# Patient Record
Sex: Female | Born: 1995 | Race: White | Hispanic: No | Marital: Single | Smoking: Current some day smoker
Health system: Southern US, Community
[De-identification: ages and names within clinical notes are randomized; demographics above are authoritative.]

## PROBLEM LIST (undated history)

## (undated) DIAGNOSIS — K831 Obstruction of bile duct: Secondary | ICD-10-CM

## (undated) DIAGNOSIS — O26619 Liver and biliary tract disorders in pregnancy, unspecified trimester: Secondary | ICD-10-CM

## (undated) DIAGNOSIS — Z789 Other specified health status: Secondary | ICD-10-CM

## (undated) DIAGNOSIS — O26649 Intrahepatic cholestasis of pregnancy, unspecified trimester: Secondary | ICD-10-CM

## (undated) DIAGNOSIS — O99011 Anemia complicating pregnancy, first trimester: Secondary | ICD-10-CM

## (undated) HISTORY — DX: Liver and biliary tract disorders in pregnancy, unspecified trimester: O26.619

## (undated) HISTORY — DX: Obstruction of bile duct: K83.1

## (undated) HISTORY — PX: NO PAST SURGERIES: SHX2092

## (undated) HISTORY — DX: Anemia complicating pregnancy, first trimester: O99.011

## (undated) HISTORY — DX: Intrahepatic cholestasis of pregnancy, unspecified trimester: O26.649

---
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
## 2017-07-25 ENCOUNTER — Encounter (HOSPITAL_COMMUNITY): Payer: Self-pay | Admitting: Obstetrics & Gynecology

## 2017-09-17 NOTE — L&D Delivery Note (Signed)
Patient is 22 y.o. G2P1001 8569w4d admitted active labor. She progressed without augmentation. AROM at 2044. Prenatal course uncomplicated.  Delivery Note At 9:08 PM a healthy female was delivered via Vaginal, Spontaneous (Presentation:ROA).  APGAR: 9, 9; weight: Pending .   Placenta status: intact.  Cord: 3 vessel cord with no avulsion from placenta with the following no complications.  Cord pH: N/A.  Anesthesia:  No epidural Episiotomy: None Lacerations: None Suture Repair: None Est. Blood Loss (mL): 100  Mom to postpartum.  Baby to Couplet care / Skin to Skin.  Rolland BimlerLexi J Betancourt, MS3 03/24/2018, 9:24 PM  OB FELLOW DELIVERY ATTESTATION  I was gloved and present for the delivery in its entirety, and I agree with the above resident's note.    Caryl AdaJazma Gamble Enderle, DO OB Fellow 10:01 PM

## 2017-10-25 ENCOUNTER — Encounter: Payer: Self-pay | Admitting: Obstetrics and Gynecology

## 2017-10-25 ENCOUNTER — Other Ambulatory Visit (HOSPITAL_COMMUNITY)
Admission: RE | Admit: 2017-10-25 | Discharge: 2017-10-25 | Disposition: A | Discharge status: Home / Self Care | Payer: Medicaid Other | Source: Ambulatory Visit | Attending: Obstetrics and Gynecology | Admitting: Obstetrics and Gynecology

## 2017-10-25 ENCOUNTER — Ambulatory Visit (INDEPENDENT_AMBULATORY_CARE_PROVIDER_SITE_OTHER): Payer: Medicaid Other | Admitting: Obstetrics and Gynecology

## 2017-10-25 DIAGNOSIS — Z3492 Encounter for supervision of normal pregnancy, unspecified, second trimester: Secondary | ICD-10-CM

## 2017-10-25 DIAGNOSIS — Z3490 Encounter for supervision of normal pregnancy, unspecified, unspecified trimester: Secondary | ICD-10-CM | POA: Insufficient documentation

## 2017-10-25 DIAGNOSIS — N949 Unspecified condition associated with female genital organs and menstrual cycle: Secondary | ICD-10-CM

## 2017-10-25 LAB — POCT URINALYSIS DIP (DEVICE)
Bilirubin Urine: NEGATIVE
Glucose, UA: NEGATIVE mg/dL
HGB URINE DIPSTICK: NEGATIVE
KETONES UR: NEGATIVE mg/dL
Nitrite: NEGATIVE
PROTEIN: NEGATIVE mg/dL
SPECIFIC GRAVITY, URINE: 1.015 (ref 1.005–1.030)
UROBILINOGEN UA: 0.2 mg/dL (ref 0.0–1.0)
pH: 7 (ref 5.0–8.0)

## 2017-10-25 NOTE — Patient Instructions (Addendum)
Childbirth Education Options: [facility] Department Classes:  Childbirth education classes can help you get ready for a positive parenting experience. You can also meet other expectant parents and get free stuff for your baby. Each class runs for five weeks on the same night and costs $45 for the mother-to-be and her support person. Medicaid covers the cost if you are eligible. Call [phone] to register. [facility] Childbirth Education:  [phone] or [phone] or [email or web address]  Baby & Me Class: Discuss newborn & infant parenting and family adjustment issues with other new mothers in a relaxed environment. Each week brings a new speaker or baby-centered activity. We encourage new mothers to join [place] every Thursday at 11:00am. Babies birth until crawling. No registration or fee. Daddy [employer]: This course offers Dads-to-be the tools and knowledge needed to feel confident on their journey to becoming new fathers. Experienced dads, who have been trained as coaches, teach dads-to-be how to hold, comfort, diaper, swaddle and play with their infant while being able to support the new mom as well. A class for men taught by men. $25/dad Big Brother/Big Sister: Let your children share in the joy of a new brother or sister in this special class designed just for them. Class includes discussion about how families care for babies: swaddling, holding, diapering, safety as well as how they can be helpful in their new role. This class is designed for children ages 45 to 48, but any age is welcome. Please register each child individually. $5/child  Mom Talk: This mom-led group offers support and connection to mothers as they journey through the adjustments and struggles of that sometimes overwhelming first year after the birth of a child. Tuesdays at 10:00am and Thursdays at 6:00pm. Babies welcome. No registration or fee. Breastfeeding Support Group: This group is a mother-to-mother  support circle where moms have the opportunity to share their breastfeeding experiences. A Lactation Consultant is present for questions and concerns. Meets each Tuesday at 11:00am. No fee or registration. Breastfeeding Your Baby: Learn what to expect in the first days of breastfeeding your newborn.  This class will help you feel more confident with the skills needed to begin your breastfeeding experience. Many new mothers are concerned about breastfeeding after leaving the hospital. This class will also address the most common fears and challenges about breastfeeding during the first few weeks, months and beyond. (call for fee) Comfort Techniques and Tour: This 2 hour interactive class will provide you the opportunity to learn & practice hands-on techniques that can help relieve some of the discomfort of labor and encourage your baby to rotate toward the best position for birth. You and your partner will be able to try a variety of labor positions with birth balls and rebozos as well as practice breathing, relaxation, and visualization techniques. A tour of the [facility] is included with this class. $20 per registrant and support person Childbirth Class- Weekend Option: This class is a Weekend version of our Birth & Baby series. It is designed for parents who have a difficult time fitting several weeks of classes into their schedule. It covers the care of your newborn and the basics of labor and childbirth. It also includes a [place] of [facility] and lunch. The class is held two consecutive days: beginning on Friday evening from 6:30 - 8:30 p.m. and the next day, Saturday from 9 a.m. - 4 p.m. (call for fee) Doren Custard Class: Interested in a waterbirth?  This  informational class will help you discover whether waterbirth is the right fit for you. Education about waterbirth itself, supplies you would need and how to assemble your support team is what you can  expect from this class. Some obstetrical practices require this class in order to pursue a waterbirth. (Not all obstetrical practices offer waterbirth-check with your healthcare provider.) Register only the expectant mom, but you are encouraged to bring your partner to class! Required if planning waterbirth, no fee. Infant/Child CPR: Parents, grandparents, babysitters, and friends learn Cardio-Pulmonary Resuscitation skills for infants and children. You will also learn how to treat both conscious and unconscious choking in infants and children. This Family & Friends program does not offer certification. Register each participant individually to ensure that enough mannequins are available. (Call for fee) Grandparent Love: Expecting a grandbaby? This class is for you! Learn about the latest infant care and safety recommendations and ways to support your own child as he or she transitions into the parenting role. Taught by Registered Nurses who are childbirth instructors, but most importantly...they are grandmothers too! $10/person. Childbirth Class- Natural Childbirth: This series of 5 weekly classes is for expectant parents who want to learn and practice natural methods of coping with the process of labor and childbirth. Relaxation, breathing, massage, visualization, role of the partner, and helpful positioning are highlighted. Participants learn how to be confident in their body's ability to give birth. This class will empower and help parents make informed decisions about their own care. Includes discussion that will help new parents transition into the immediate postpartum period. Maternity Care Center Tour of [facility] is included. We suggest taking this class between 25-32 weeks, but it's only a recommendation. $75 per registrant and one support person or $30 Medicaid. Childbirth Class- 3 week Series: This option of 3 weekly classes helps you and your labor partner prepare for childbirth. Newborn  care, labor & birth, cesarean birth, pain management, and comfort techniques are discussed and a Maternity Care Center Tour of [facility] is included. The class meets at the same time, on the same day of the week for 3 consecutive weeks beginning with the starting date you choose. $60 for registrant and one support person.  Marvelous Multiples: Expecting twins, triplets, or more? This class covers the differences in labor, birth, parenting, and breastfeeding issues that face multiples' parents. NICU tour is included. Led by a Certified Childbirth Educator who is the mother of twins. No fee. Caring for Baby: This class is for expectant and adoptive parents who want to learn and practice the most up-to-date newborn care for their babies. Focus is on birth through the first six weeks of life. Topics include feeding, bathing, diapering, crying, umbilical cord care, circumcision care and safe sleep. Parents learn to recognize symptoms of illness and when to call the pediatrician. Register only the mom-to-be and your partner or support person can plan to come with you! $10 per registrant and support person Childbirth Class- online option: This online class offers you the freedom to complete a Birth and Baby series in the comfort of your own home. The flexibility of this option allows you to review sections at your own pace, at times convenient to you and your support people. It includes additional video information, animations, quizzes, and extended activities. Get organized with helpful eClass tools, checklists, and trackers. Once you register online for the class, you will receive an email within a few days to accept the invitation and begin the class when the time   is right for you. The content will be available to you for 60 days. $60 for 60 days of online access for you and your support people.  Local Doulas: Natural Baby Doulas [email or web address] Tel:  [phone] [email or web address] [employer] [phone] [email or web address] The Labor Hassell Halim  (also do waterbirth tub rental) [phone] [email or web address] [facility] Birth Doula [phone] [email or web address] NotebookDistributors.fi Sacred Rhythms  [phone] https://[email or web address]-[email or web address]/ [employer] (PADA) [email or web address] https://www.[email or web address]/ [place] Birth and Baby  http://[email or web address]/ Considering Waterbirth? Guide for patients at [facility] [employer]  Why consider waterbirth?  . Gentle birth for babies . Less pain medicine used in labor . May allow for passive descent/less pushing . May reduce perineal tears  . More mobility and instinctive maternal position changes . Increased maternal relaxation . Reduced blood pressure in labor  Is waterbirth safe? What are the risks of infection, drowning or other complications?  . Infection: o Very low risk (3.7 % for tub vs 4.8% for bed) o 7 in 8000 waterbirths with documented infection o Poorly cleaned equipment most common cause o Slightly lower group B strep transmission rate  . Drowning o Maternal:  - Very low risk   - Related to seizures or fainting o Newborn:  - Very low risk. No evidence of increased risk of respiratory problems in multiple large studies - Physiological protection from breathing under water - Avoid underwater birth if there are any fetal complications - Once baby's head is out of the water, keep it out.  . Birth complication o Some reports of cord trauma, but risk decreased by bringing baby to surface gradually o No evidence of increased risk of shoulder dystocia. Mothers can usually change positions faster in water than in a bed, possibly aiding the maneuvers to free the shoulder.   You must attend a Doren Custard class at [facility]  3rd Wednesday of every month from 7-9pm  [employer] by calling [phone] or online at [email or web address]  Bring [place] the certificate from the class to your prenatal appointment  Meet with a midwife at 36 weeks to see if you can still plan a waterbirth and to sign the consent.   Purchase or rent the following supplies:   Water Birth Pool (Birth Pool in a Box or [place] for instance)  (Tubs start ~$125)  Single-use disposable tub liner designed for your brand of tub  New garden hose labeled "lead-free", "suitable for drinking water",  Electric drain pump to remove water (We recommend 792 gallon per hour or greater pump.)   Separate garden hose to remove the dirty water  Fish net  Bathing suit top (optional)  Long-handled mirror (optional)  Places to purchase or rent supplies  GotWebTools.is for tub purchases and supplies  Waterbirthsolutions.com for tub purchases and supplies  The Labor Ladies (www.[email or web address]) $275 for tub rental/set-up & take down/kit   [employer] (http://www.fleming.com/.htm) Information regarding doulas (labor support) who provide pool rentals  Our practice has a Birth Pool in a Box tub at the hospital that you may borrow on a first-come-first-served basis. It is your responsibility to to set up, clean and break down the tub. We cannot guarantee the availability of this tub in advance. You are responsible for bringing all accessories listed above. If you do not have all necessary supplies you cannot have a waterbirth.    Things that would prevent you from having a waterbirth:  Premature, <37wks  Previous cesarean birth  Presence of thick meconium-stained fluid  Multiple gestation (Twins,  triplets, etc.)  Uncontrolled diabetes or gestational diabetes requiring medication  Hypertension requiring medication or diagnosis of pre-eclampsia  Heavy vaginal bleeding  Non-reassuring fetal  heart rate  Active infection (MRSA, etc.). Group B Strep is NOT a contraindication for  waterbirth.  If your labor has to be induced and induction method requires continuous  monitoring of the baby's heart rate  Other risks/issues identified by your obstetrical provider  Please remember that birth is unpredictable. Under certain unforeseeable circumstances your provider may advise against giving birth in the tub. These decisions will be made on a case-by-case basis and with the safety of you and your baby as our highest priority.      Round Ligament Pain During Pregnancy   Round ligament pain is a sharp pain or jabbing feeling often felt in the lower belly or groin area on one or both sides. It is one of the most common complaints during pregnancy and is considered a normal part of pregnancy. It is most often felt during the second trimester.   Here is what you need to know about round ligament pain, including some tips to help you feel better.   Causes of Round Ligament Pain:    Several thick ligaments surround and support your womb (uterus) as it grows during pregnancy. One of them is called the round ligament.   The round ligament connects the front part of the womb to your groin, the area where your legs attach to your pelvis. The round ligament normally tightens and relaxes slowly.   As your baby and womb grow, the round ligament stretches. That makes it more likely to become strained.   Sudden movements can cause the ligament to tighten quickly, like a rubber band snapping. This causes a sudden and quick jabbing feeling.   Symptoms of Round Ligament Pain   Round ligament pain can be concerning and uncomfortable. But it is considered normal as your body changes during pregnancy.   The symptoms of round ligament pain include a sharp, sudden spasm in the belly. It usually affects the right side, but it may happen on both sides. The pain only lasts a few seconds.   Exercise may  cause the pain, as will rapid movements such as:   sneezing  coughing  laughing  rolling over in bed  standing up too quickly   Treatment of Round Ligament Pain   Here are some tips that may help reduce your discomfort:   Pain relief. Take over-the-counter acetaminophen for pain, if necessary. Ask your doctor if this is OK.   Exercise. Get plenty of exercise to keep your stomach (core) muscles strong. Doing stretching exercises or prenatal yoga can be helpful. Ask your doctor which exercises are safe for you and your baby.   A helpful exercise involves putting your hands and knees on the floor, lowering your head, and pushing your backside into the air.   Avoid sudden movements. Change positions slowly (such as standing up or sitting down) to avoid sudden movements that may cause stretching and pain.   Flex your hips. Bend and flex your hips before you cough, sneeze, or laugh to avoid pulling on the ligaments.   Apply warmth. A heating pad or warm bath may be helpful. Ask your doctor if this is OK. Extreme heat can be dangerous to the baby.   You should try to modify your daily activity level and avoid positions that may worsen the condition.   When to Call the Doctor/Midwife  Always tell your doctor or midwife about any type of pain you have during pregnancy. Round ligament pain is quick and doesn't last long.   Call your health care provider immediately if you have:   severe pain  fever  chills  pain on urination  difficulty walking   Belly pain during pregnancy can be due to many different causes. It is important for your doctor to rule out more serious conditions, including pregnancy complications such as placenta abruption or non-pregnancy illnesses such as:   inguinal hernia  appendicitis  stomach, liver, and kidney problems  Preterm labor pains may sometimes be mistaken for round ligament pain.

## 2017-10-25 NOTE — Progress Notes (Signed)
Subjective:   Shannon Trujillo is a 22 y.o. G2P1001 at [redacted]w[redacted]d by LMP being seen today for her first obstetrical visit.  Her obstetrical history is significant for healthy. Her first baby was placed up for adoption.  Patient does intend to breast feed. Pregnancy history fully reviewed. This pregnancy unplanned,  however happy. FOB is involved; living together.   Patient reports Bilateral groin pain worse in the morning when she wakes up and stretches .  HISTORY: Obstetric History   G2   P1   T1   P0   A0   L1    SAB0   TAB0   Ectopic0   Multiple0   Live Births1     # Outcome Date GA Lbr Len/2nd Weight Sex Delivery Anes PTL Lv  2 Current           1 Term 12/03/15 [redacted]w[redacted]d 05:19 / 00:20 6 lb 8 oz (2.948 kg) F Vag-Spont None  LIV     Name: Stephenson,Sefora GIRL     Apgar1:  8                Apgar5: 9      Last pap smear was done N/A and was N/A  Past Medical History:  Diagnosis Date  . Medical history non-contributory    Past Surgical History:  Procedure Laterality Date  . NO PAST SURGERIES     Family History  Problem Relation Age of Onset  . Diabetes Paternal Grandmother    Social History   Tobacco Use  . Smoking status: Former Games developer  . Smokeless tobacco: Former Engineer, water Use Topics  . Alcohol use: No  . Drug use: No   No Known Allergies No current outpatient medications on file prior to visit.   No current facility-administered medications on file prior to visit.     Review of Systems Pertinent items noted in HPI and remainder of comprehensive ROS otherwise negative.  Exam   Vitals:   10/25/17 1116  BP: 110/64  Pulse: 78  Weight: 128 lb (58.1 kg)   Fetal Heart Rate (bpm): 151  BP 110/64   Pulse 78   Wt 128 lb (58.1 kg)   LMP 06/13/2017 (Exact Date)    Uterine Size: size equals dates  Pelvic Exam: Deferred    Skin: normal coloration and turgor, no rashes    Neurologic: negative   Extremities: normal strength, tone, and muscle mass   HEENT neck  supple with midline trachea and thyroid without masses   Mouth/Teeth mucous membranes moist, pharynx normal without lesions   Neck supple and no masses   Cardiovascular: regular rate and rhythm, no murmurs or gallops   Respiratory:  appears well, vitals normal, no respiratory distress, acyanotic, normal RR, neck free of mass or lymphadenopathy, chest clear, no wheezing, crepitations, rhonchi, normal symmetric air entry   Abdomen: soft, non-tender; bowel sounds normal; no masses,  no organomegaly     Assessment:   Pregnancy: G2P1001 Patient Active Problem List   Diagnosis Date Noted  . Supervision of low-risk pregnancy 10/25/2017  . Round ligament pain 10/25/2017     Plan:  1. Encounter for supervision of low-risk pregnancy in second trimester  - Culture, OB Urine - Cystic fibrosis gene test - Obstetric Panel, Including HIV - [place] MFM OB COMP + 14 WK; Future - GC/Chlamydia probe amp ([facility]not at [facility]  2. Round ligament pain  Pregnancy support belt recommended   Initial labs drawn. Continue prenatal  vitamins. Genetic Screening discussed declined. Ultrasound discussed; fetal anatomic survey: requested and scheduled  Problem list reviewed and updated. The nature of  - [facility] with multiple MDs and other Advanced Practice Providers was explained to patient; also emphasized that residents, students are part of our team. Routine obstetric precautions reviewed. Return in about 4 weeks (around 11/22/2017).   Malin Sambrano, Harolyn RutherfordJennifer I, NP  Faculty Practice Center for [facility], [facility]

## 2017-10-28 LAB — GC/CHLAMYDIA PROBE AMP (~~LOC~~) NOT AT ARMC
Chlamydia: NEGATIVE
NEISSERIA GONORRHEA: NEGATIVE

## 2017-10-29 LAB — URINE CULTURE, OB REFLEX

## 2017-10-29 LAB — CULTURE, OB URINE

## 2017-10-30 ENCOUNTER — Encounter (HOSPITAL_COMMUNITY): Payer: Self-pay | Admitting: Obstetrics and Gynecology

## 2017-11-01 LAB — OBSTETRIC PANEL, INCLUDING HIV
Antibody Screen: NEGATIVE
BASOS: 0 %
Basophils Absolute: 0 10*3/uL (ref 0.0–0.2)
EOS (ABSOLUTE): 0 10*3/uL (ref 0.0–0.4)
Eos: 0 %
HEMATOCRIT: 32.2 % — AB (ref 34.0–46.6)
HIV SCREEN 4TH GENERATION: NONREACTIVE
Hemoglobin: 10.9 g/dL — ABNORMAL LOW (ref 11.1–15.9)
Hepatitis B Surface Ag: NEGATIVE
IMMATURE GRANULOCYTES: 0 %
Immature Grans (Abs): 0 10*3/uL (ref 0.0–0.1)
LYMPHS: 15 %
Lymphocytes Absolute: 1.4 10*3/uL (ref 0.7–3.1)
MCH: 28.7 pg (ref 26.6–33.0)
MCHC: 33.9 g/dL (ref 31.5–35.7)
MCV: 85 fL (ref 79–97)
MONOCYTES: 7 %
Monocytes Absolute: 0.6 10*3/uL (ref 0.1–0.9)
Neutrophils Absolute: 7 10*3/uL (ref 1.4–7.0)
Neutrophils: 78 %
Platelets: 177 10*3/uL (ref 150–379)
RBC: 3.8 x10E6/uL (ref 3.77–5.28)
RDW: 15 % (ref 12.3–15.4)
RPR Ser Ql: NONREACTIVE
Rh Factor: POSITIVE
Rubella Antibodies, IGG: 7.41 index (ref >0.99)
WBC: 9.1 10*3/uL (ref 3.4–10.8)

## 2017-11-01 LAB — CYSTIC FIBROSIS GENE TEST

## 2017-11-06 ENCOUNTER — Encounter: Payer: Self-pay | Admitting: *Deleted

## 2017-11-06 ENCOUNTER — Other Ambulatory Visit: Payer: Self-pay | Admitting: Obstetrics and Gynecology

## 2017-11-06 ENCOUNTER — Telehealth: Payer: Self-pay | Admitting: Family Medicine

## 2017-11-06 ENCOUNTER — Ambulatory Visit (HOSPITAL_COMMUNITY)
Admission: RE | Admit: 2017-11-06 | Discharge: 2017-11-06 | Disposition: A | Discharge status: Home / Self Care | Payer: Medicaid Other | Source: Ambulatory Visit | Attending: Obstetrics and Gynecology | Admitting: Obstetrics and Gynecology

## 2017-11-06 DIAGNOSIS — Z3492 Encounter for supervision of normal pregnancy, unspecified, second trimester: Secondary | ICD-10-CM

## 2017-11-06 DIAGNOSIS — Z363 Encounter for antenatal screening for malformations: Secondary | ICD-10-CM | POA: Insufficient documentation

## 2017-11-06 DIAGNOSIS — Z3A20 20 weeks gestation of pregnancy: Secondary | ICD-10-CM | POA: Diagnosis not present

## 2017-11-06 DIAGNOSIS — Z3490 Encounter for supervision of normal pregnancy, unspecified, unspecified trimester: Secondary | ICD-10-CM

## 2017-11-06 NOTE — Telephone Encounter (Signed)
Patient is requesting a Rx for Prenatal Vitamins.

## 2017-11-12 MED ORDER — PREPLUS 27-1 MG PO TABS: 1.0000 | ORAL_TABLET | Freq: Every day | ORAL | 9 refills | Status: DC

## 2017-11-12 NOTE — Telephone Encounter (Signed)
I called Shannon Trujillo and was unable to leave a message- phone rang - no one answered and no voice mail.  RX sent in to pharmacy on file.

## 2017-11-22 ENCOUNTER — Ambulatory Visit (INDEPENDENT_AMBULATORY_CARE_PROVIDER_SITE_OTHER): Payer: Self-pay | Admitting: Medical

## 2017-11-22 VITALS — BP 103/66 | HR 80 | Wt 131.3 lb

## 2017-11-22 DIAGNOSIS — Z3490 Encounter for supervision of normal pregnancy, unspecified, unspecified trimester: Secondary | ICD-10-CM

## 2017-11-22 NOTE — Progress Notes (Signed)
   PRENATAL VISIT NOTE  Subjective:  Shannon Trujillo is a 22 y.o. G2P1001 at 8347w1d being seen today for ongoing prenatal care.  She is currently monitored for the following issues for this low-risk pregnancy and has Supervision of low-risk pregnancy and Round ligament pain on their problem list.  Patient reports occasional lower abdominal pain.  Contractions: Not present. Vag. Bleeding: None.  Movement: Present. Denies leaking of fluid.   The following portions of the patient's history were reviewed and updated as appropriate: allergies, current medications, past family history, past medical history, past social history, past surgical history and problem list. Problem list updated.  Objective:   Vitals:   11/22/17 1435  BP: 103/66  Pulse: 80  Weight: 131 lb 4.8 oz (59.6 kg)    Fetal Status: Fetal Heart Rate (bpm): 136 Fundal Height: 24 cm Movement: Present     General:  Alert, oriented and cooperative. Patient is in no acute distress.  Skin: Skin is warm and dry. No rash noted.   Cardiovascular: Normal heart rate noted  Respiratory: Normal respiratory effort, no problems with respiration noted  Abdomen: Soft, gravid, appropriate for gestational age.  Pain/Pressure: Present     Pelvic: Cervical exam deferred        Extremities: Normal range of motion.  Edema: None  Mental Status:  Normal mood and affect. Normal behavior. Normal judgment and thought content.   Assessment and Plan:  Pregnancy: G2P1001 at 7847w1d  1. Encounter for supervision of low-risk pregnancy, antepartum - US MFM OB FOLLOW UP; scheduled  Preterm labor symptoms and general obstetric precautions including but not limited to vaginal bleeding, contractions, leaking of fluid and fetal movement were reviewed in detail with the patient. Please refer to After Visit Summary for other counseling recommendations.  Return in about 4 weeks (around 12/20/2017) for LOB, 28 week labs (fasting).   Vonzella NippleJulie Wenzel, PA-C

## 2017-11-22 NOTE — Patient Instructions (Addendum)
Second Trimester of Pregnancy The second trimester is from week 13 through week 28, month 4 through 6. This is often the time in pregnancy that you feel your best. Often times, morning sickness has lessened or quit. You may have more energy, and you may get hungry more often. Your unborn baby (fetus) is growing rapidly. At the end of the sixth month, he or she is about 9 inches long and weighs about 1 pounds. You will likely feel the baby move (quickening) between 18 and 20 weeks of pregnancy. Follow these instructions at home:  Avoid all smoking, herbs, and alcohol. Avoid drugs not approved by your doctor.  Do not use any tobacco products, including cigarettes, chewing tobacco, and electronic cigarettes. If you need help quitting, ask your doctor. You may get counseling or other support to help you quit.  Only take medicine as told by your doctor. Some medicines are safe and some are not during pregnancy.  Exercise only as told by your doctor. Stop exercising if you start having cramps.  Eat regular, healthy meals.  Wear a good support bra if your breasts are tender.  Do not use hot tubs, steam rooms, or saunas.  Wear your seat belt when driving.  Avoid raw meat, uncooked cheese, and liter boxes and soil used by cats.  Take your prenatal vitamins.  Take 1500-2000 milligrams of calcium daily starting at the 20th week of pregnancy until you deliver your baby.  Try taking medicine that helps you poop (stool softener) as needed, and if your doctor approves. Eat more fiber by eating fresh fruit, vegetables, and whole grains. Drink enough fluids to keep your pee (urine) clear or pale yellow.  Take warm water baths (sitz baths) to soothe pain or discomfort caused by hemorrhoids. Use hemorrhoid cream if your doctor approves.  If you have puffy, bulging veins (varicose veins), wear support hose. Raise (elevate) your feet for 15 minutes, 3-4 times a day. Limit salt in your diet.  Avoid heavy  lifting, wear low heals, and sit up straight.  Rest with your legs raised if you have leg cramps or low back pain.  Visit your dentist if you have not gone during your pregnancy. Use a soft toothbrush to brush your teeth. Be gentle when you floss.  You can have sex (intercourse) unless your doctor tells you not to.  Go to your doctor visits. Get help if:  You feel dizzy.  You have mild cramps or pressure in your lower belly (abdomen).  You have a nagging pain in your belly area.  You continue to feel sick to your stomach (nauseous), throw up (vomit), or have watery poop (diarrhea).  You have bad smelling fluid coming from your vagina.  You have pain with peeing (urination). Get help right away if:  You have a fever.  You are leaking fluid from your vagina.  You have spotting or bleeding from your vagina.  You have severe belly cramping or pain.  You lose or gain weight rapidly.  You have trouble catching your breath and have chest pain.  You notice sudden or extreme puffiness (swelling) of your face, hands, ankles, feet, or legs.  You have not felt the baby move in over an hour.  You have severe headaches that do not go away with medicine.  You have vision changes. This information is not intended to replace advice given to you by your health care provider. Make sure you discuss any questions you have with your health care   provider. Document Released: 11/28/2009 Document Revised: 02/09/2016 Document Reviewed: 11/04/2012 Elsevier Interactive Patient Education  2017 [place] [address] [place], [place]  [address] Phone - [phone]   Fax - [phone]  [facility] [address] [place], [place] [address] Phone - [phone]   Fax - [place] [address] [place], [place]  [address] Phone - [phone]   Fax -  [phone]  [place] [address] [place] [address] [place], [place]  [address] Phone - [phone]   Fax - [phone]  [place] [address] [place], [place]  [address] Phone - [phone]   Fax - [phone]  [facility] [address] [place], [place]  [address] Phone - [phone]   Fax - [place] [address], [place], [place]  [address] Phone - [phone]   Fax - [phone]  [place] [address] [place], [place] [address] [place], [place]  [address] Phone - [phone]   Fax - [place] [address] [place], [place]  [address] Phone - [phone]   Fax - [phone] [facility] [place] [address] [place], [place]  [address] Phone - [phone]   Fax - [phone]  [facility] [place] [address] [place], [place]  [address] Phone - [phone]   Fax - [phone]  [facility] [place], [place], [place]  [address] Phone - [phone]   Fax - [place] [address] [place], [place]  [address] Phone - [phone]   Fax - [phone]  [facility] [address], [place], [place]  [address] Phone - [place], [place]  [address] Phone - [phone]   Fax - [place] [address] [place], [place]  [address] Phone - [phone]   Fax - [phone]  [place] [address] [place], [place]  [address] Phone - [phone]   Fax - [place]. [place], [place]  [address] Phone - [phone]   Fax - [place], [place], [place]   [address] Phone - [phone]   Fax - [place] [address] [place], [place]  [address] Phone - [phone]   Fax - [phone]  [facility] [address] [place], [place]  [address] Phone - [phone]   Fax - [place] [address], [place], [place]  [address] Phone - [phone]   Fax - [phone]  [place] [address] [place], [place]  [address] Phone - [phone]   Fax - [phone] [place] [address] [place], [place]  [address] Phone - [phone]   Fax - [place] [address] [place],   [address] Phone - [phone]   Fax - [place] [address], [place],   [address] Phone - [phone]   Fax - [phone]  [place] MD [address] [place] [address] Phone  [phone]  Fax [phone]   

## 2017-12-06 ENCOUNTER — Other Ambulatory Visit: Payer: Self-pay | Admitting: Medical

## 2017-12-06 ENCOUNTER — Ambulatory Visit (HOSPITAL_COMMUNITY)
Admission: RE | Admit: 2017-12-06 | Discharge: 2017-12-06 | Disposition: A | Discharge status: Home / Self Care | Payer: Medicaid Other | Source: Ambulatory Visit | Attending: Medical | Admitting: Medical

## 2017-12-06 DIAGNOSIS — Z0489 Encounter for examination and observation for other specified reasons: Secondary | ICD-10-CM

## 2017-12-06 DIAGNOSIS — Z3490 Encounter for supervision of normal pregnancy, unspecified, unspecified trimester: Secondary | ICD-10-CM

## 2017-12-06 DIAGNOSIS — IMO0002 Reserved for concepts with insufficient information to code with codable children: Secondary | ICD-10-CM

## 2017-12-06 DIAGNOSIS — Z3492 Encounter for supervision of normal pregnancy, unspecified, second trimester: Secondary | ICD-10-CM | POA: Insufficient documentation

## 2017-12-06 DIAGNOSIS — Z3A25 25 weeks gestation of pregnancy: Secondary | ICD-10-CM | POA: Diagnosis not present

## 2017-12-20 ENCOUNTER — Other Ambulatory Visit: Payer: Self-pay | Admitting: *Deleted

## 2017-12-20 ENCOUNTER — Other Ambulatory Visit: Payer: Medicaid Other

## 2017-12-20 ENCOUNTER — Ambulatory Visit (INDEPENDENT_AMBULATORY_CARE_PROVIDER_SITE_OTHER): Payer: Medicaid Other | Admitting: Student

## 2017-12-20 VITALS — BP 110/67 | HR 77 | Wt 136.2 lb

## 2017-12-20 DIAGNOSIS — Z3493 Encounter for supervision of normal pregnancy, unspecified, third trimester: Secondary | ICD-10-CM

## 2017-12-20 DIAGNOSIS — Z23 Encounter for immunization: Secondary | ICD-10-CM

## 2017-12-20 DIAGNOSIS — Z3492 Encounter for supervision of normal pregnancy, unspecified, second trimester: Secondary | ICD-10-CM

## 2017-12-20 NOTE — Progress Notes (Signed)
   PRENATAL VISIT NOTE  Subjective:  Shannon Trujillo is a 22 y.o. G2P1001 at 7740w1d being seen today for ongoing prenatal care.  She is currently monitored for the following issues for this low-risk pregnancy and has Supervision of low-risk pregnancy and Round ligament pain on their problem list.  Patient reports no complaints.  Contractions: Not present. Vag. Bleeding: None.  Movement: Present. Denies leaking of fluid.   The following portions of the patient's history were reviewed and updated as appropriate: allergies, current medications, past family history, past medical history, past social history, past surgical history and problem list. Problem list updated.  Objective:   Vitals:   12/20/17 1055  BP: 110/67  Pulse: 77  Weight: 136 lb 3.2 oz (61.8 kg)    Fetal Status: Fetal Heart Rate (bpm): 147 Fundal Height: 27 cm Movement: Present     General:  Alert, oriented and cooperative. Patient is in no acute distress.  Skin: Skin is warm and dry. No rash noted.   Cardiovascular: Normal heart rate noted  Respiratory: Normal respiratory effort, no problems with respiration noted  Abdomen: Soft, gravid, appropriate for gestational age.  Pain/Pressure: Absent     Pelvic: Cervical exam deferred        Extremities: Normal range of motion.  Edema: None  Mental Status: Normal mood and affect. Normal behavior. Normal judgment and thought content.   Assessment and Plan:  Pregnancy: G2P1001 at 5540w1d  1. Encounter for supervision of low-risk pregnancy in second trimester  - Tdap vaccine greater than or equal to 7yo IM  Preterm labor symptoms and general obstetric precautions including but not limited to vaginal bleeding, contractions, leaking of fluid and fetal movement were reviewed in detail with the patient. Please refer to After Visit Summary for other counseling recommendations.  Return in about 2 weeks (around 01/03/2018) for Routine OB.  Future Appointments  Date Time Provider  Department Center  01/07/2018  2:15 PM Pincus LargePhelps, Jazma Y, DO [facility]    Judeth HornErin Haleema Vanderheyden, NP

## 2017-12-20 NOTE — Patient Instructions (Signed)
Third Trimester of Pregnancy The third trimester is from week 28 through week 40 (months 7 through 9). The third trimester is a time when the unborn baby (fetus) is growing rapidly. At the end of the ninth month, the fetus is about 20 inches in length and weighs 6-10 pounds. Body changes during your third trimester Your body will continue to go through many changes during pregnancy. The changes vary from woman to woman. During the third trimester:  Your weight will continue to increase. You can expect to gain 25-35 pounds (11-16 kg) by the end of the pregnancy.  You may begin to get stretch marks on your hips, abdomen, and breasts.  You may urinate more often because the fetus is moving lower into your pelvis and pressing on your bladder.  You may develop or continue to have heartburn. This is caused by increased hormones that slow down muscles in the digestive tract.  You may develop or continue to have constipation because increased hormones slow digestion and cause the muscles that push waste through your intestines to relax.  You may develop hemorrhoids. These are swollen veins (varicose veins) in the rectum that can itch or be painful.  You may develop swollen, bulging veins (varicose veins) in your legs.  You may have increased body aches in the pelvis, back, or thighs. This is due to weight gain and increased hormones that are relaxing your joints.  You may have changes in your hair. These can include thickening of your hair, rapid growth, and changes in texture. Some women also have hair loss during or after pregnancy, or hair that feels dry or thin. Your hair will most likely return to normal after your baby is born.  Your breasts will continue to grow and they will continue to become tender. A yellow fluid (colostrum) may leak from your breasts. This is the first milk you are producing for your baby.  Your belly button may stick out.  You may notice more swelling in your hands,  face, or ankles.  You may have increased tingling or numbness in your hands, arms, and legs. The skin on your belly may also feel numb.  You may feel short of breath because of your expanding uterus.  You may have more problems sleeping. This can be caused by the size of your belly, increased need to urinate, and an increase in your body's metabolism.  You may notice the fetus "dropping," or moving lower in your abdomen (lightening).  You may have increased vaginal discharge.  You may notice your joints feel loose and you may have pain around your pelvic bone.  What to expect at prenatal visits You will have prenatal exams every 2 weeks until week 36. Then you will have weekly prenatal exams. During a routine prenatal visit:  You will be weighed to make sure you and the baby are growing normally.  Your blood pressure will be taken.  Your abdomen will be measured to track your baby's growth.  The fetal heartbeat will be listened to.  Any test results from the previous visit will be discussed.  You may have a cervical check near your due date to see if your cervix has softened or thinned (effaced).  You will be tested for Group B streptococcus. This happens between 35 and 37 weeks.  Your health care provider may ask you:  What your birth plan is.  How you are feeling.  If you are feeling the baby move.  If you have had   any abnormal symptoms, such as leaking fluid, bleeding, severe headaches, or abdominal cramping.  If you are using any tobacco products, including cigarettes, chewing tobacco, and electronic cigarettes.  If you have any questions.  Other tests or screenings that may be performed during your third trimester include:  Blood tests that check for low iron levels (anemia).  Fetal testing to check the health, activity level, and growth of the fetus. Testing is done if you have certain medical conditions or if there are problems during the  pregnancy.  Nonstress test (NST). This test checks the health of your baby to make sure there are no signs of problems, such as the baby not getting enough oxygen. During this test, a belt is placed around your belly. The baby is made to move, and its heart rate is monitored during movement.  What is false labor? False labor is a condition in which you feel small, irregular tightenings of the muscles in the womb (contractions) that usually go away with rest, changing position, or drinking water. These are called Braxton Hicks contractions. Contractions may last for hours, days, or even weeks before true labor sets in. If contractions come at regular intervals, become more frequent, increase in intensity, or become painful, you should see your health care provider. What are the signs of labor?  Abdominal cramps.  Regular contractions that start at 10 minutes apart and become stronger and more frequent with time.  Contractions that start on the top of the uterus and spread down to the lower abdomen and back.  Increased pelvic pressure and dull back pain.  A watery or bloody mucus discharge that comes from the vagina.  Leaking of amniotic fluid. This is also known as your "water breaking." It could be a slow trickle or a gush. Let your health care provider know if it has a color or strange odor. If you have any of these signs, call your health care provider right away, even if it is before your due date. Follow these instructions at home: Medicines  Follow your health care provider's instructions regarding medicine use. Specific medicines may be either safe or unsafe to take during pregnancy.  Take a prenatal vitamin that contains at least 600 micrograms (mcg) of folic acid.  If you develop constipation, try taking a stool softener if your health care provider approves. Eating and drinking  Eat a balanced diet that includes fresh fruits and vegetables, whole grains, good sources of protein  such as meat, eggs, or tofu, and low-fat dairy. Your health care provider will help you determine the amount of weight gain that is right for you.  Avoid raw meat and uncooked cheese. These carry germs that can cause birth defects in the baby.  If you have low calcium intake from food, talk to your health care provider about whether you should take a daily calcium supplement.  Eat four or five small meals rather than three large meals a day.  Limit foods that are high in fat and processed sugars, such as fried and sweet foods.  To prevent constipation: ? Drink enough fluid to keep your urine clear or pale yellow. ? Eat foods that are high in fiber, such as fresh fruits and vegetables, whole grains, and beans. Activity  Exercise only as directed by your health care provider. Most women can continue their usual exercise routine during pregnancy. Try to exercise for 30 minutes at least 5 days a week. Stop exercising if you experience uterine contractions.  Avoid heavy   lifting.  Do not exercise in extreme heat or humidity, or at high altitudes.  Wear low-heel, comfortable shoes.  Practice good posture.  You may continue to have sex unless your health care provider tells you otherwise. Relieving pain and discomfort  Take frequent breaks and rest with your legs elevated if you have leg cramps or low back pain.  Take warm sitz baths to soothe any pain or discomfort caused by hemorrhoids. Use hemorrhoid cream if your health care provider approves.  Wear a good support bra to prevent discomfort from breast tenderness.  If you develop varicose veins: ? Wear support pantyhose or compression stockings as told by your healthcare provider. ? Elevate your feet for 15 minutes, 3-4 times a day. Prenatal care  Write down your questions. Take them to your prenatal visits.  Keep all your prenatal visits as told by your health care provider. This is important. Safety  Wear your seat belt at  all times when driving.  Make a list of emergency phone numbers, including numbers for family, friends, the hospital, and police and fire departments. General instructions  Avoid cat litter boxes and soil used by cats. These carry germs that can cause birth defects in the baby. If you have a cat, ask someone to clean the litter box for you.  Do not travel far distances unless it is absolutely necessary and only with the approval of your health care provider.  Do not use hot tubs, steam rooms, or saunas.  Do not drink alcohol.  Do not use any products that contain nicotine or tobacco, such as cigarettes and e-cigarettes. If you need help quitting, ask your health care provider.  Do not use any medicinal herbs or unprescribed drugs. These chemicals affect the formation and growth of the baby.  Do not douche or use tampons or scented sanitary pads.  Do not cross your legs for long periods of time.  To prepare for the arrival of your baby: ? Take prenatal classes to understand, practice, and ask questions about labor and delivery. ? Make a trial run to the hospital. ? Visit the hospital and tour the maternity area. ? Arrange for maternity or paternity leave through employers. ? Arrange for family and friends to take care of pets while you are in the hospital. ? Purchase a rear-facing car seat and make sure you know how to install it in your car. ? Pack your hospital bag. ? Prepare the baby's nursery. Make sure to remove all pillows and stuffed animals from the baby's crib to prevent suffocation.  Visit your dentist if you have not gone during your pregnancy. Use a soft toothbrush to brush your teeth and be gentle when you floss. Contact a health care provider if:  You are unsure if you are in labor or if your water has broken.  You become dizzy.  You have mild pelvic cramps, pelvic pressure, or nagging pain in your abdominal area.  You have lower back pain.  You have persistent  nausea, vomiting, or diarrhea.  You have an unusual or bad smelling vaginal discharge.  You have pain when you urinate. Get help right away if:  Your water breaks before 37 weeks.  You have regular contractions less than 5 minutes apart before 37 weeks.  You have a fever.  You are leaking fluid from your vagina.  You have spotting or bleeding from your vagina.  You have severe abdominal pain or cramping.  You have rapid weight loss or weight gain.    You have shortness of breath with chest pain.  You notice sudden or extreme swelling of your face, hands, ankles, feet, or legs.  Your baby makes fewer than 10 movements in 2 hours.  You have severe headaches that do not go away when you take medicine.  You have vision changes. Summary  The third trimester is from week 28 through week 40, months 7 through 9. The third trimester is a time when the unborn baby (fetus) is growing rapidly.  During the third trimester, your discomfort may increase as you and your baby continue to gain weight. You may have abdominal, leg, and back pain, sleeping problems, and an increased need to urinate.  During the third trimester your breasts will keep growing and they will continue to become tender. A yellow fluid (colostrum) may leak from your breasts. This is the first milk you are producing for your baby.  False labor is a condition in which you feel small, irregular tightenings of the muscles in the womb (contractions) that eventually go away. These are called Braxton Hicks contractions. Contractions may last for hours, days, or even weeks before true labor sets in.  Signs of labor can include: abdominal cramps; regular contractions that start at 10 minutes apart and become stronger and more frequent with time; watery or bloody mucus discharge that comes from the vagina; increased pelvic pressure and dull back pain; and leaking of amniotic fluid. This information is not intended to replace advice  given to you by your health care provider. Make sure you discuss any questions you have with your health care provider. Document Released: 08/28/2001 Document Revised: 02/09/2016 Document Reviewed: 11/04/2012 Elsevier Interactive Patient Education  2017 [employer]  

## 2017-12-21 LAB — CBC
Hematocrit: 31.5 % — ABNORMAL LOW (ref 34.0–46.6)
Hemoglobin: 10.4 g/dL — ABNORMAL LOW (ref 11.1–15.9)
MCH: 28.7 pg (ref 26.6–33.0)
MCHC: 33 g/dL (ref 31.5–35.7)
MCV: 87 fL (ref 79–97)
PLATELETS: 195 10*3/uL (ref 150–379)
RBC: 3.62 x10E6/uL — ABNORMAL LOW (ref 3.77–5.28)
RDW: 14.4 % (ref 12.3–15.4)
WBC: 8.7 10*3/uL (ref 3.4–10.8)

## 2017-12-21 LAB — GLUCOSE TOLERANCE, 2 HOURS W/ 1HR
GLUCOSE, 1 HOUR: 78 mg/dL (ref 65–179)
GLUCOSE, 2 HOUR: 87 mg/dL (ref 65–152)
GLUCOSE, FASTING: 76 mg/dL (ref 65–91)

## 2017-12-21 LAB — RPR: RPR: NONREACTIVE

## 2017-12-21 LAB — HIV ANTIBODY (ROUTINE TESTING W REFLEX): HIV SCREEN 4TH GENERATION: NONREACTIVE

## 2018-01-07 ENCOUNTER — Ambulatory Visit (INDEPENDENT_AMBULATORY_CARE_PROVIDER_SITE_OTHER): Payer: Medicaid Other | Admitting: Obstetrics and Gynecology

## 2018-01-07 VITALS — BP 95/62 | HR 82 | Wt 139.0 lb

## 2018-01-07 DIAGNOSIS — Z3493 Encounter for supervision of normal pregnancy, unspecified, third trimester: Secondary | ICD-10-CM

## 2018-01-09 NOTE — Progress Notes (Signed)
Subjective:  Shannon Trujillo is a 22 y.o. G2P1001 at 5942w0d being seen today for ongoing prenatal care.  She is currently monitored for the following issues for this low-risk pregnancy and has Supervision of low-risk pregnancy and Round ligament pain on their problem list.  Patient reports no complaints.  Contractions: Not present. Vag. Bleeding: None.  Movement: Present. Denies leaking of fluid.   The following portions of the patient's history were reviewed and updated as appropriate: allergies, current medications, past family history, past medical history, past social history, past surgical history and problem list. Problem list updated.  Objective:   Vitals:   01/07/18 1439  BP: 95/62  Pulse: 82  Weight: 63 kg (139 lb)    Fetal Status: Fetal Heart Rate (bpm): 159 Fundal Height: 29 cm Movement: Present     General:  Alert, oriented and cooperative. Patient is in no acute distress.  Skin: Skin is warm and dry. No rash noted.   Cardiovascular: Normal heart rate noted  Respiratory: Normal respiratory effort, no problems with respiration noted  Abdomen: Soft, gravid, appropriate for gestational age. Pain/Pressure: Absent     Pelvic: Vag. Bleeding: None     Cervical exam deferred        Extremities: Normal range of motion.  Edema: None  Mental Status: Normal mood and affect. Normal behavior. Normal judgment and thought content.   Urinalysis:      Assessment and Plan:  Pregnancy: G2P1001 at 5042w0d  1. Encounter for supervision of low-risk pregnancy in third trimester Doing well. Reviewed 28wk labs which were normal.   Preterm labor symptoms and general obstetric precautions including but not limited to vaginal bleeding, contractions, leaking of fluid and fetal movement were reviewed in detail with the patient. Please refer to After Visit Summary for other counseling recommendations.  Return in about 2 weeks (around 01/21/2018).   Pincus LargePhelps, Jazma Y, DO

## 2018-01-21 ENCOUNTER — Ambulatory Visit (INDEPENDENT_AMBULATORY_CARE_PROVIDER_SITE_OTHER): Payer: Medicaid Other | Admitting: Nurse Practitioner

## 2018-01-21 VITALS — BP 109/66 | HR 87 | Wt 140.7 lb

## 2018-01-21 DIAGNOSIS — Z3490 Encounter for supervision of normal pregnancy, unspecified, unspecified trimester: Secondary | ICD-10-CM

## 2018-01-21 NOTE — Progress Notes (Signed)
    Subjective:  Shannon Trujillo is a 22 y.o. G2P1001 at [redacted]w[redacted]d being seen today for ongoing prenatal care.  She is currently monitored for the following issues for this low-risk pregnancy and has Supervision of low-risk pregnancy and Round ligament pain on their problem list.  Patient reports no complaints.  Contractions: Not present. Vag. Bleeding: None.  Movement: Present. Denies leaking of fluid.   The following portions of the patient's history were reviewed and updated as appropriate: allergies, current medications, past family history, past medical history, past social history, past surgical history and problem list. Problem list updated.  Objective:   Vitals:   01/21/18 1441  BP: 109/66  Pulse: 87  Weight: 140 lb 11.2 oz (63.8 kg)    Fetal Status: Fetal Heart Rate (bpm): 149 Fundal Height: 32 cm Movement: Present     General:  Alert, oriented and cooperative. Patient is in no acute distress.  Skin: Skin is warm and dry. No rash noted.   Cardiovascular: Normal heart rate noted  Respiratory: Normal respiratory effort, no problems with respiration noted  Abdomen: Soft, gravid, appropriate for gestational age. Pain/Pressure: Absent     Pelvic:  Cervical exam deferred        Extremities: Normal range of motion.  Edema: Trace  Mental Status: Normal mood and affect. Normal behavior. Normal judgment and thought content.   Urinalysis:    NA  Assessment and Plan:  Pregnancy: G2P1001 at [redacted]w[redacted]d  1. Encounter for supervision of low-risk pregnancy, antepartum Encouraged drinking more fluids. Encouraged taking a breastfeeding class at [facility] Began discussion of postpartum contraception and gave Bedsider.org as an online site to review various methods.  Recommended Nexplanon and IUD but she is very unsure.  Preterm labor symptoms and general obstetric precautions including but not limited to vaginal bleeding, contractions, leaking of fluid and fetal movement were reviewed in detail with the  patient. Please refer to After Visit Summary for other counseling recommendations.  Return in about 2 weeks (around 02/04/2018).  Nolene Bernheim, RN, MSN, NP-BC Nurse Practitioner, [facility] [place] for [employer], [facility] 01/21/2018 6:07 PM

## 2018-01-21 NOTE — Progress Notes (Signed)
CSW A. Linton Rump met privately with MOB to discuss social, parenting and contraception options. MOB reports she has a good support system and FOB is supportive. MOB reports she an interest in OCP, CSW provided MOB with additional contraception options. CSW completed a referral for [facility] services and appt is scheduled for Thursday May 9 _0  at [facility] office.

## 2018-01-21 NOTE — Patient Instructions (Addendum)
Check Bedsider.org for information on contraception  Third Trimester of Pregnancy The third trimester is from week 29 through week 42, months 7 through 9. This trimester is when your unborn baby (fetus) is growing very fast. At the end of the ninth month, the unborn baby is about 20 inches in length. It weighs about 6-10 pounds. Follow these instructions at home:  Avoid all smoking, herbs, and alcohol. Avoid drugs not approved by your doctor.  Do not use any tobacco products, including cigarettes, chewing tobacco, and electronic cigarettes. If you need help quitting, ask your doctor. You may get counseling or other support to help you quit.  Only take medicine as told by your doctor. Some medicines are safe and some are not during pregnancy.  Exercise only as told by your doctor. Stop exercising if you start having cramps.  Eat regular, healthy meals.  Wear a good support bra if your breasts are tender.  Do not use hot tubs, steam rooms, or saunas.  Wear your seat belt when driving.  Avoid raw meat, uncooked cheese, and liter boxes and soil used by cats.  Take your prenatal vitamins.  Take 1500-2000 milligrams of calcium daily starting at the 20th week of pregnancy until you deliver your baby.  Try taking medicine that helps you poop (stool softener) as needed, and if your doctor approves. Eat more fiber by eating fresh fruit, vegetables, and whole grains. Drink enough fluids to keep your pee (urine) clear or pale yellow.  Take warm water baths (sitz baths) to soothe pain or discomfort caused by hemorrhoids. Use hemorrhoid cream if your doctor approves.  If you have puffy, bulging veins (varicose veins), wear support hose. Raise (elevate) your feet for 15 minutes, 3-4 times a day. Limit salt in your diet.  Avoid heavy lifting, wear low heels, and sit up straight.  Rest with your legs raised if you have leg cramps or low back pain.  Visit your dentist if you have not gone during  your pregnancy. Use a soft toothbrush to brush your teeth. Be gentle when you floss.  You can have sex (intercourse) unless your doctor tells you not to.  Do not travel far distances unless you must. Only do so with your doctor's approval.  Take prenatal classes.  Practice driving to the hospital.  Pack your hospital bag.  Prepare the baby's room.  Go to your doctor visits. Get help if:  You are not sure if you are in labor or if your water has broken.  You are dizzy.  You have mild cramps or pressure in your lower belly (abdominal).  You have a nagging pain in your belly area.  You continue to feel sick to your stomach (nauseous), throw up (vomit), or have watery poop (diarrhea).  You have bad smelling fluid coming from your vagina.  You have pain with peeing (urination). Get help right away if:  You have a fever.  You are leaking fluid from your vagina.  You are spotting or bleeding from your vagina.  You have severe belly cramping or pain.  You lose or gain weight rapidly.  You have trouble catching your breath and have chest pain.  You notice sudden or extreme puffiness (swelling) of your face, hands, ankles, feet, or legs.  You have not felt the baby move in over an hour.  You have severe headaches that do not go away with medicine.  You have vision changes. This information is not intended to replace advice given to  you by your health care provider. Make sure you discuss any questions you have with your health care provider. Document Released: 11/28/2009 Document Revised: 02/09/2016 Document Reviewed: 11/04/2012 Elsevier Interactive Patient Education  2017 [employer].

## 2018-02-04 ENCOUNTER — Ambulatory Visit (INDEPENDENT_AMBULATORY_CARE_PROVIDER_SITE_OTHER): Payer: Medicaid Other | Admitting: Obstetrics and Gynecology

## 2018-02-04 VITALS — BP 97/64 | HR 84 | Wt 142.4 lb

## 2018-02-04 DIAGNOSIS — Z3493 Encounter for supervision of normal pregnancy, unspecified, third trimester: Secondary | ICD-10-CM

## 2018-02-04 NOTE — Progress Notes (Signed)
Subjective:  Shannon Trujillo is a 22 y.o. G2P1001 at [redacted]w[redacted]d being seen today for ongoing prenatal care.  She is currently monitored for the following issues for this low-risk pregnancy and has Supervision of low-risk pregnancy and Round ligament pain on their problem list.  Patient reports no complaints.  Contractions: Not present. Vag. Bleeding: None.  Movement: Present. Denies leaking of fluid.   The following portions of the patient's history were reviewed and updated as appropriate: allergies, current medications, past family history, past medical history, past social history, past surgical history and problem list. Problem list updated.  Objective:   Vitals:   02/04/18 1415  BP: 97/64  Pulse: 84  Weight: 142 lb 6.4 oz (64.6 kg)    Fetal Status: Fetal Heart Rate (bpm): 162 Fundal Height: 31 cm Movement: Present     General:  Alert, oriented and cooperative. Patient is in no acute distress.  Skin: Skin is warm and dry. No rash noted.   Cardiovascular: Normal heart rate noted  Respiratory: Normal respiratory effort, no problems with respiration noted  Abdomen: Soft, gravid, appropriate for gestational age. Pain/Pressure: Absent     Pelvic: Vag. Bleeding: None     Cervical exam deferred        Extremities: Normal range of motion.  Edema: None  Mental Status: Normal mood and affect. Normal behavior. Normal judgment and thought content.   Urinalysis:      Assessment and Plan:  Pregnancy: G2P1001 at [redacted]w[redacted]d  1. Encounter for supervision of low-risk pregnancy in third trimester Patient doing well overall with no complains. Patient stated that she has not yet decided on contraceptive plan moving forward but is deciding between oral contraceptive options. We will continue with routine care.  Term labor symptoms and general obstetric precautions including but not limited to vaginal bleeding, contractions, leaking of fluid and fetal movement were reviewed in detail with the patient. Please  refer to After Visit Summary for other counseling recommendations.  Return in about 2 weeks (around 02/18/2018) for ob visit.   Roselia Snipe, Michaell Cowing, Medical Student

## 2018-02-04 NOTE — Patient Instructions (Signed)

## 2018-02-18 ENCOUNTER — Other Ambulatory Visit (HOSPITAL_COMMUNITY)
Admission: RE | Admit: 2018-02-18 | Discharge: 2018-02-18 | Disposition: A | Discharge status: Home / Self Care | Payer: Medicaid Other | Source: Ambulatory Visit | Attending: Obstetrics and Gynecology | Admitting: Obstetrics and Gynecology

## 2018-02-18 ENCOUNTER — Ambulatory Visit (INDEPENDENT_AMBULATORY_CARE_PROVIDER_SITE_OTHER): Payer: Medicaid Other | Admitting: Obstetrics and Gynecology

## 2018-02-18 VITALS — BP 110/58 | HR 63 | Wt 142.0 lb

## 2018-02-18 DIAGNOSIS — Z3493 Encounter for supervision of normal pregnancy, unspecified, third trimester: Secondary | ICD-10-CM | POA: Insufficient documentation

## 2018-02-18 DIAGNOSIS — O99011 Anemia complicating pregnancy, first trimester: Secondary | ICD-10-CM

## 2018-02-18 DIAGNOSIS — N949 Unspecified condition associated with female genital organs and menstrual cycle: Secondary | ICD-10-CM

## 2018-02-18 HISTORY — DX: Anemia complicating pregnancy, first trimester: O99.011

## 2018-02-18 LAB — OB RESULTS CONSOLE GBS: GBS: NEGATIVE

## 2018-02-18 LAB — OB RESULTS CONSOLE GC/CHLAMYDIA: Gonorrhea: NEGATIVE

## 2018-02-18 NOTE — Progress Notes (Signed)
   PRENATAL VISIT NOTE  Subjective:  Shannon Trujillo is a 22 y.o. G2P1001 at 4677w5d being seen today for ongoing prenatal care.  She is currently monitored for the following issues for this low-risk pregnancy and has Supervision of low-risk pregnancy; Round ligament pain; and Anemia in pregnancy on their problem list.  Patient reports no complaints.  Contractions: Not present. Vag. Bleeding: None.  Movement: Present. Denies leaking of fluid.   The following portions of the patient's history were reviewed and updated as appropriate: allergies, current medications, past family history, past medical history, past social history, past surgical history and problem list. Problem list updated.  Objective:   Vitals:   02/18/18 1020  BP: (!) 110/58  Pulse: 63  Weight: 142 lb (64.4 kg)    Fetal Status: Fetal Heart Rate (bpm): 140 Fundal Height: 34 cm Movement: Present  Presentation: Undeterminable  General:  Alert, oriented and cooperative. Patient is in no acute distress.  Skin: Skin is warm and dry. No rash noted.   Cardiovascular: Normal heart rate noted  Respiratory: Normal respiratory effort, no problems with respiration noted  Abdomen: Soft, gravid, appropriate for gestational age.  Pain/Pressure: Absent     Pelvic: Cervical exam performed Dilation: Fingertip Effacement (%): 50 Station: -3  Extremities: Normal range of motion.  Edema: None  Mental Status: Normal mood and affect. Normal behavior. Normal judgment and thought content.   Assessment and Plan:  Pregnancy: G2P1001 at 6377w5d  1. Encounter for supervision of low-risk pregnancy in third trimester  - Culture, beta strep (group b only) - [facility]lam[facility] - She is considering post placental IUD placement.   2. Anemia in pregnancy  Last Hgb 10.4, continue prenatal vitamins   3. Round ligament pain  Doing better    Preterm labor symptoms and general obstetric precautions including but not  limited to vaginal bleeding, contractions, leaking of fluid and fetal movement were reviewed in detail with the patient. Please refer to After Visit Summary for other counseling recommendations.  No follow-ups on file.  No future appointments.  Venia CarbonJennifer Cecilia Nishikawa, NP

## 2018-02-18 NOTE — Patient Instructions (Signed)

## 2018-02-19 LAB — GC/CHLAMYDIA PROBE AMP (~~LOC~~) NOT AT ARMC
CHLAMYDIA, DNA PROBE: NEGATIVE
Neisseria Gonorrhea: NEGATIVE

## 2018-02-22 LAB — CULTURE, BETA STREP (GROUP B ONLY): STREP GP B CULTURE: NEGATIVE

## 2018-02-24 ENCOUNTER — Telehealth: Payer: Self-pay | Admitting: Licensed Clinical Social Worker

## 2018-02-24 NOTE — Telephone Encounter (Signed)
CSW A. Felton ClintonFigueroa confirmed appt

## 2018-02-25 ENCOUNTER — Ambulatory Visit (INDEPENDENT_AMBULATORY_CARE_PROVIDER_SITE_OTHER): Payer: Medicaid Other | Admitting: Obstetrics and Gynecology

## 2018-02-25 ENCOUNTER — Encounter: Payer: Self-pay | Admitting: Obstetrics and Gynecology

## 2018-02-25 VITALS — BP 119/51 | HR 80

## 2018-02-25 DIAGNOSIS — Z3493 Encounter for supervision of normal pregnancy, unspecified, third trimester: Secondary | ICD-10-CM

## 2018-02-25 NOTE — Progress Notes (Signed)
Patient ID: Shannon Trujillo, female   DOB: 02/12/1996, 22 y.o.   MRN: [id number]   PRENATAL VISIT NOTE  Subjective:  Shannon Parishshley S Shepardson is a 22 y.o. G2P1001 at 7960w5d being seen today for ongoing prenatal care.  She is currently monitored for the following issues for this low-risk pregnancy and has Supervision of low-risk pregnancy; Round ligament pain; and Anemia in pregnancy on their problem list.  Patient reports no complaints. Pt. States that baby has been very active "especially after [employer]". She denies any constitutional symptoms, uncontrolled Headaches, chest pains, pelvic pain or unsual discharge or edema. Contractions: Not present. Vag. Bleeding: None.  Movement: Present. Denies leaking of fluid.   The following portions of the patient's history were reviewed and updated as appropriate: allergies, current medications, past family history, past medical history, past social history, past surgical history and problem list. Problem list updated.  Objective:   Vitals:   02/25/18 1408  BP: (!) 119/51  Pulse: 80    Fetal Status: Fetal Heart Rate (bpm): 143 Fundal Height: 37 cm Movement: Present     General:  Alert, oriented and cooperative. Patient is in no acute distress.  Skin: Skin is warm and dry. No rash noted.   Cardiovascular: Normal heart rate noted  Respiratory: Normal respiratory effort, no problems with respiration noted  Abdomen: Soft, gravid, appropriate for gestational age. Fundal Height measuring 37cm  Pain/Pressure: Absent     Pelvic: Cervical exam deferred        Extremities: Normal range of motion.  Edema: None  Mental Status: Normal mood and affect. Normal behavior. Normal judgment and thought content.   Assessment and Plan:  Pregnancy: G2P1001 at 2160w5d  There are no diagnoses linked to this encounter. Preterm labor symptoms and general obstetric precautions including but not limited to vaginal bleeding, contractions, leaking of fluid and fetal movement were  reviewed in detail with the patient. Please refer to After Visit Summary for other counseling recommendations.  Discussed with Ms. Spayd that if she had no further questions or concern, we will see her for follow-up in about 1 week.  Ameen Mostafa Winona LegatoM Deamber Buckhalter, RN, FNP (Student)

## 2018-02-25 NOTE — Progress Notes (Signed)
   PRENATAL VISIT NOTE  Subjective:  Shannon Trujillo is a 22 y.o. G2P1001 at 6294w5d being seen today for ongoing prenatal care.  She is currently monitored for the following issues for this low-risk pregnancy and has Supervision of low-risk pregnancy; Round ligament pain; and Anemia in pregnancy on their problem list.  Patient reports no complaints.  Contractions: Not present. Vag. Bleeding: None.  Movement: Present. Denies leaking of fluid.   The following portions of the patient's history were reviewed and updated as appropriate: allergies, current medications, past family history, past medical history, past social history, past surgical history and problem list. Problem list updated.  Objective:   Vitals:   02/25/18 1408  BP: (!) 119/51  Pulse: 80    Fetal Status: Fetal Heart Rate (bpm): 143   Movement: Present     General:  Alert, oriented and cooperative. Patient is in no acute distress.  Skin: Skin is warm and dry. No rash noted.   Cardiovascular: Normal heart rate noted  Respiratory: Normal respiratory effort, no problems with respiration noted  Abdomen: Soft, gravid, appropriate for gestational age.  Pain/Pressure: Absent     Pelvic: Cervical exam deferred        Extremities: Normal range of motion.  Edema: None  Mental Status: Normal mood and affect. Normal behavior. Normal judgment and thought content.   Assessment and Plan:  Pregnancy: G2P1001 at 9094w5d  1. Encounter for supervision of low-risk pregnancy in third trimester  - GBS negative   There are no diagnoses linked to this encounter. Preterm labor symptoms and general obstetric precautions including but not limited to vaginal bleeding, contractions, leaking of fluid and fetal movement were reviewed in detail with the patient. Please refer to After Visit Summary for other counseling recommendations.  No follow-ups on file.  No future appointments.  Venia CarbonJennifer Rasch, NP

## 2018-03-04 ENCOUNTER — Ambulatory Visit (INDEPENDENT_AMBULATORY_CARE_PROVIDER_SITE_OTHER): Payer: Medicaid Other | Admitting: Advanced Practice Midwife

## 2018-03-04 VITALS — BP 127/78 | Wt 144.2 lb

## 2018-03-04 DIAGNOSIS — O26843 Uterine size-date discrepancy, third trimester: Secondary | ICD-10-CM

## 2018-03-04 DIAGNOSIS — Z3493 Encounter for supervision of normal pregnancy, unspecified, third trimester: Secondary | ICD-10-CM

## 2018-03-04 NOTE — Progress Notes (Signed)
   PRENATAL VISIT NOTE  Subjective:  Shannon Trujillo is a 22 y.o. G2P1001 at 7830w5d being seen today for ongoing prenatal care.  She is currently monitored for the following issues for this low-risk pregnancy and has Supervision of low-risk pregnancy; Round ligament pain; and Anemia in pregnancy on their problem list.  Patient reports no complaints.  Contractions: Not present. Vag. Bleeding: None.  Movement: Present. Denies leaking of fluid.   The following portions of the patient's history were reviewed and updated as appropriate: allergies, current medications, past family history, past medical history, past social history, past surgical history and problem list. Problem list updated.  Objective:   Vitals:   03/04/18 1649  BP: 127/78  Weight: 144 lb 3.2 oz (65.4 kg)    Fetal Status: Fetal Heart Rate (bpm): 140   Movement: Present  Presentation: Vertex  General:  Alert, oriented and cooperative. Patient is in no acute distress.  Skin: Skin is warm and dry. No rash noted.   Cardiovascular: Normal heart rate noted  Respiratory: Normal respiratory effort, no problems with respiration noted  Abdomen: Soft, gravid, appropriate for gestational age.  Pain/Pressure: Present     Pelvic: Cervical exam deferred Dilation: 1 Effacement (%): 60 Station: -2  Extremities: Normal range of motion.  Edema: None  Mental Status: Normal mood and affect. Normal behavior. Normal judgment and thought content.   Assessment and Plan:  Pregnancy: G2P1001 at 3530w5d  1. Encounter for supervision of low-risk pregnancy in third trimester --Anticipatory guidance about next weeks of pregnancy and prenatal visits.  2. Uterine size date discrepancy pregnancy, third trimester --Fetal station lower on exam today, fetal head now engaged in pelvis. This may be why fundal height is lower but significant change from previous measurements and 3+ cm from weeks of pregnancy so US ordered for EFW at term. Leopolds EFW 5.5-6lbs  today.  - US MFM OB FOLLOW UP; Future  Term labor symptoms and general obstetric precautions including but not limited to vaginal bleeding, contractions, leaking of fluid and fetal movement were reviewed in detail with the patient. Please refer to After Visit Summary for other counseling recommendations.  Return in about 1 week (around 03/11/2018).  Future Appointments  Date Time Provider Department Center  03/12/2018  3:35 PM Rolm BookbinderNeill, Caroline M, [facility]    Sharen CounterLisa Leftwich-Kirby, CNM

## 2018-03-04 NOTE — Patient Instructions (Signed)

## 2018-03-07 ENCOUNTER — Other Ambulatory Visit: Payer: Self-pay

## 2018-03-07 ENCOUNTER — Other Ambulatory Visit: Payer: Self-pay | Admitting: Advanced Practice Midwife

## 2018-03-07 ENCOUNTER — Ambulatory Visit (HOSPITAL_COMMUNITY)
Admission: RE | Admit: 2018-03-07 | Discharge: 2018-03-07 | Disposition: A | Discharge status: Home / Self Care | Payer: Medicaid Other | Source: Ambulatory Visit | Attending: Advanced Practice Midwife | Admitting: Advanced Practice Midwife

## 2018-03-07 DIAGNOSIS — Z362 Encounter for other antenatal screening follow-up: Secondary | ICD-10-CM

## 2018-03-07 DIAGNOSIS — O26843 Uterine size-date discrepancy, third trimester: Secondary | ICD-10-CM

## 2018-03-07 DIAGNOSIS — Z3A38 38 weeks gestation of pregnancy: Secondary | ICD-10-CM | POA: Diagnosis not present

## 2018-03-07 NOTE — Progress Notes (Signed)
Opened in Error.

## 2018-03-12 ENCOUNTER — Ambulatory Visit (INDEPENDENT_AMBULATORY_CARE_PROVIDER_SITE_OTHER): Payer: Medicaid Other | Admitting: Medical

## 2018-03-12 ENCOUNTER — Other Ambulatory Visit: Payer: Self-pay

## 2018-03-12 VITALS — BP 111/68 | HR 81 | Wt 145.0 lb

## 2018-03-12 DIAGNOSIS — Z3493 Encounter for supervision of normal pregnancy, unspecified, third trimester: Secondary | ICD-10-CM

## 2018-03-12 NOTE — Patient Instructions (Addendum)
BENEFITS OF BREASTFEEDING Many women wonder if they should breastfeed. Research shows that breast milk contains the perfect balance of vitamins, protein and fat that your baby needs to grow. It also contains antibodies that help your baby's immune system to fight off viruses and bacteria and can reduce the risk of sudden infant death syndrome (SIDS). In addition, the colostrum (a fluid secreted from the breast in the first few days after delivery) helps your newborn's digestive system to grow and function well. Breast milk is easier to digest than formula. Also, if your baby is born preterm, breast milk can help to reduce both short- and long-term health problems. BENEFITS OF BREASTFEEDING FOR MOM . Breastfeeding causes a hormone to be released that helps the uterus to contract and return to its normal size more quickly. . It aids in postpartum weight loss, reduces risk of breast and ovarian cancer, heart disease and rheumatoid arthritis. . It decreases the amount of bleeding after the baby is born. benefits of breastfeeding for baby . Provides comfort and nutrition . Protects baby against - Obesity - Diabetes - Asthma - Childhood cancers - Heart disease - Ear infections - Diarrhea - Pneumonia - Stomach problems - Serious allergies - Skin rashes . Promotes growth and development . Reduces the risk of baby having Sudden Infant Death Syndrome (SIDS) only breastmilk for the first 6 months . Protects baby against diseases/allergies . It's the perfect amount for tiny bellies . It restores baby's energy . Provides the best nutrition for baby . Giving water or formula can make baby more likely to get sick, decrease Mom's milk supply, make baby less content with breastfeeding Skin to Skin After delivery, the staff will place your baby on your chest. This helps with the following: . Regulates baby's temperature, breathing, heart rate and blood sugar . Increases Mom's milk supply . Promotes  bonding . Keeps baby and Mom calm and decreases baby's crying Rooming In Your baby will stay in your room with you for the entire time you are in the hospital. This helps with the following: . Allows Mom to learn baby's feeding cues - Fluttering eyes - Sucking on tongue or hand - Rooting (opens mouth and turns head) - Nuzzling into the breast - Bringing hand to mouth . Allows breastfeeding on demand (when your baby is ready) . Helps baby to be calm and content . Ensures a good milk supply . Prevents complications with breastfeeding . Allows parents to learn to care for baby . Allows you to request assistance with breastfeeding Importance of a good latch . Increases milk transfer to baby - baby gets enough milk . Ensures you have enough milk for your baby . Decreases nipple soreness . Don't use pacifiers and bottles - these cause baby to suck differently than breastfeeding . Promotes continuation of breastfeeding Risks of Formula Supplementation with Breastfeeding Giving your infant formula in addition to your breast-milk EXCEPT when medically necessary can lead to: . Decreases your milk supply  . Loss of confidence in yourself for providing baby's nutrition  . Engorgement and possibly mastitis  . Asthma & allergies in the baby BREASTFEEDING FAQS How long should I breastfeed my baby? It is recommended that you provide your baby with breast milk only for the first 6 months and then continue for the first year and longer as desired. During the first few weeks after birth, your baby will need to feed 8-12 times every 24 hours, or every 2-3 hours. They will likely feed   for 15-30 minutes. How can I help my baby begin breastfeeding? Babies are born with an instinct to breastfeed. A healthy baby can begin breastfeeding right away without specific help. At the hospital, a nurse (or lactation consultant) will help you begin the process and will give you tips on good positioning. It may be  helpful to take a breastfeeding class before you deliver in order to know what to expect. How can I help my baby latch on? In order to assist your baby in latching-on, cup your breast in your hand and stroke your baby's lower lip with your nipple to stimulate your baby's rooting reflex. Your baby will look like he or she is yawning, at which point you should bring the baby towards your breast, while aiming the nipple at the roof of his or her mouth. Remember to bring the baby towards you and not your breast towards the baby. How can I tell if my baby is latched-on? Your baby will have all of your nipple and part of the dark area around the nipple in his or her mouth and your baby's nose will be touching your breast. You should see or hear the baby swallowing. If the baby is not latched-on properly, start the process over. To remove the suction, insert a clean finger between your breast and the baby's mouth. Should I switch breasts during feeding? After feeding on one side, switch the baby to your other breast. If he or she does not continue feeding - that is OK. Your baby will not necessarily need to feed from both breasts in a single feeding. On the next feeding, start with the other breast for efficiency and comfort. How can I tell if my baby is hungry? When your baby is hungry, they will nuzzle against your breast, make sucking noises and tongue motions and may put their hands near their mouth. Crying is a late sign of hunger, so you should not wait until this point. When they have received enough milk, they will unlatch from the breast. Is it okay to use a pacifier? Until your baby gets the hang of breastfeeding, experts recommend limiting pacifier usage. If you have questions about this, please contact your pediatrician. What can I do to ensure proper nutrition while breastfeeding? . Make sure that you support your own health and your baby's by eating a healthy, well-balanced diet . Your provider  may recommend that you continue to take your prenatal vitamin . Drink plenty of fluids. It is a good rule to drink one glass of water before or after feeding . Alcohol will remain in the breast milk for as long as it will remain in the blood stream. If you choose to have a drink, it is recommended that you wait at least 2 hours before feeding . Moderate amounts of caffeine are OK . Some over-the-counter or prescription medications are not recommended during breastfeeding. Check with your provider if you have questions What types of birth control methods are safe while breastfeeding? Progestin-only methods, including a daily pill, an IUD, the implant and the injection are safe while breastfeeding. Methods that contain estrogen (such as combination birth control pills, the vaginal ring and the patch) should not be used during the first month of breastfeeding as these can decrease your milk supply.   Research childbirth classes and hospital preregistration at [email or web address]  Fetal Movement Counts Patient Name: ________________________________________________ Patient Due Date: ____________________ What is a fetal movement count? A fetal movement count is the number of  times that you feel your baby move during a certain amount of time. This may also be called a fetal kick count. A fetal movement count is recommended for every pregnant woman. You may be asked to start counting fetal movements as early as week 28 of your pregnancy. Pay attention to when your baby is most active. You may notice your baby's sleep and wake cycles. You may also notice things that make your baby move more. You should do a fetal movement count:  When your baby is normally most active.  At the same time each day.  A good time to count movements is while you are resting, after having something to eat and drink. How do I count fetal movements? 1. Find a quiet, comfortable area. Sit, or lie down on your side. 2. Write  down the date, the start time and stop time, and the number of movements that you felt between those two times. Take this information with you to your health care visits. 3. For 2 hours, count kicks, flutters, swishes, rolls, and jabs. You should feel at least 10 movements during 2 hours. 4. You may stop counting after you have felt 10 movements. 5. If you do not feel 10 movements in 2 hours, have something to eat and drink. Then, keep resting and counting for 1 hour. If you feel at least 4 movements during that hour, you may stop counting. Contact a health care provider if:  You feel fewer than 4 movements in 2 hours.  Your baby is not moving like he or she usually does. Date: ____________ Start time: ____________ Stop time: ____________ Movements: ____________ Date: ____________ Start time: ____________ Stop time: ____________ Movements: ____________ Date: ____________ Start time: ____________ Stop time: ____________ Movements: ____________ Date: ____________ Start time: ____________ Stop time: ____________ Movements: ____________ Date: ____________ Start time: ____________ Stop time: ____________ Movements: ____________ Date: ____________ Start time: ____________ Stop time: ____________ Movements: ____________ Date: ____________ Start time: ____________ Stop time: ____________ Movements: ____________ Date: ____________ Start time: ____________ Stop time: ____________ Movements: ____________ Date: ____________ Start time: ____________ Stop time: ____________ Movements: ____________ This information is not intended to replace advice given to you by your health care provider. Make sure you discuss any questions you have with your health care provider. Document Released: 10/03/2006 Document Revised: 05/02/2016 Document Reviewed: 10/13/2015 Elsevier Interactive Patient Education  2018 [employer]  [employer] Corporation[employer]xton Hicks Contractions Contractions of the uterus can occur throughout pregnancy, but they  are not always a sign that you are in labor. You may have practice contractions called Braxton Hicks contractions. These false labor contractions are sometimes confused with true labor. What are Deberah PeltonBraxton Hicks contractions? Braxton Hicks contractions are tightening movements that occur in the muscles of the uterus before labor. Unlike true labor contractions, these contractions do not result in opening (dilation) and thinning of the cervix. Toward the end of pregnancy (32-34 weeks), Braxton Hicks contractions can happen more often and may become stronger. These contractions are sometimes difficult to tell apart from true labor because they can be very uncomfortable. You should not feel embarrassed if you go to the hospital with false labor. Sometimes, the only way to tell if you are in true labor is for your health care provider to look for changes in the cervix. The health care provider will do a physical exam and may monitor your contractions. If you are not in true labor, the exam should show that your cervix is not dilating and your water has not broken.  If there are other health problems associated with your pregnancy, it is completely safe for you to be sent home with false labor. You may continue to have Braxton Hicks contractions until you go into true labor. How to tell the difference between true labor and false labor True labor  Contractions last 30-70 seconds.  Contractions become very regular.  Discomfort is usually felt in the top of the uterus, and it spreads to the lower abdomen and low back.  Contractions do not go away with walking.  Contractions usually become more intense and increase in frequency.  The cervix dilates and gets thinner. False labor  Contractions are usually shorter and not as strong as true labor contractions.  Contractions are usually irregular.  Contractions are often felt in the front of the lower abdomen and in the groin.  Contractions may go away  when you walk around or change positions while lying down.  Contractions get weaker and are shorter-lasting as time goes on.  The cervix usually does not dilate or become thin. Follow these instructions at home:  Take over-the-counter and prescription medicines only as told by your health care provider.  Keep up with your usual exercises and follow other instructions from your health care provider.  Eat and drink lightly if you think you are going into labor.  If Braxton Hicks contractions are making you uncomfortable: ? Change your position from lying down or resting to walking, or change from walking to resting. ? Sit and rest in a tub of warm water. ? Drink enough fluid to keep your urine pale yellow. Dehydration may cause these contractions. ? Do slow and deep breathing several times an hour.  Keep all follow-up prenatal visits as told by your health care provider. This is important. Contact a health care provider if:  You have a fever.  You have continuous pain in your abdomen. Get help right away if:  Your contractions become stronger, more regular, and closer together.  You have fluid leaking or gushing from your vagina.  You pass blood-tinged mucus (bloody show).  You have bleeding from your vagina.  You have low back pain that you never had before.  You feel your baby's head pushing down and causing pelvic pressure.  Your baby is not moving inside you as much as it used to. Summary  Contractions that occur before labor are called Braxton Hicks contractions, false labor, or practice contractions.  Braxton Hicks contractions are usually shorter, weaker, farther apart, and less regular than true labor contractions. True labor contractions usually become progressively stronger and regular and they become more frequent.  Manage discomfort from [facility] contractions by changing position, resting in a warm bath, drinking plenty of water, or practicing deep  breathing. This information is not intended to replace advice given to you by your health care provider. Make sure you discuss any questions you have with your health care provider. Document Released: 01/17/2017 Document Revised: 01/17/2017 Document Reviewed: 01/17/2017 Elsevier Interactive Patient Education  2018 [employer].

## 2018-03-12 NOTE — Progress Notes (Signed)
   PRENATAL VISIT NOTE  Subjective:  Shannon Trujillo is a 9[name]89 y.o. G2P1001 at 6641w6d being seen today for ongoing prenatal care.  She is currently monitored for the following issues for this low-risk pregnancy and has Supervision of low-risk pregnancy; Round ligament pain; and Anemia in pregnancy on their problem list.  Patient reports no complaints.  Contractions: Not present. Vag. Bleeding: None.  Movement: Present. Denies leaking of fluid.   The following portions of the patient's history were reviewed and updated as appropriate: allergies, current medications, past family history, past medical history, past social history, past surgical history and problem list. Problem list updated.  Objective:   Vitals:   03/12/18 1547  BP: 111/68  Pulse: 81  Weight: 145 lb (65.8 kg)    Fetal Status: Fetal Heart Rate (bpm): 135 Fundal Height: 38 cm Movement: Present  Presentation: Vertex  General:  Alert, oriented and cooperative. Patient is in no acute distress.  Skin: Skin is warm and dry. No rash noted.   Cardiovascular: Normal heart rate noted  Respiratory: Normal respiratory effort, no problems with respiration noted  Abdomen: Soft, gravid, appropriate for gestational age.  Pain/Pressure: Present     Pelvic: Cervical exam performed Dilation: 1.5 Effacement (%): 60 Station: -2  Extremities: Normal range of motion.  Edema: None  Mental Status: Normal mood and affect. Normal behavior. Normal judgment and thought content.   Assessment and Plan:  Pregnancy: G2P1001 at 2241w6d  1. Encounter for supervision of low-risk pregnancy in third trimester - Doing well, consider scheduling IOL at next visit  - GBS negative, discussed with patient   Term labor symptoms and general obstetric precautions including but not limited to vaginal bleeding, contractions, leaking of fluid and fetal movement were reviewed in detail with the patient. Please refer to After Visit Summary for other counseling  recommendations.  Return in about 1 week (around 03/19/2018) for LOB.  Vonzella NippleJulie Maelyn Berrey, PA-C

## 2018-03-21 ENCOUNTER — Ambulatory Visit: Payer: Self-pay

## 2018-03-21 ENCOUNTER — Ambulatory Visit (INDEPENDENT_AMBULATORY_CARE_PROVIDER_SITE_OTHER): Payer: Medicaid Other | Admitting: Obstetrics and Gynecology

## 2018-03-21 VITALS — BP 113/75 | HR 74 | Wt 147.4 lb

## 2018-03-21 DIAGNOSIS — O480 Post-term pregnancy: Secondary | ICD-10-CM | POA: Insufficient documentation

## 2018-03-21 DIAGNOSIS — Z3493 Encounter for supervision of normal pregnancy, unspecified, third trimester: Secondary | ICD-10-CM

## 2018-03-21 NOTE — Progress Notes (Signed)

## 2018-03-21 NOTE — Progress Notes (Signed)
   PRENATAL VISIT NOTE  Subjective:  Shannon Trujillo is a 22 y.o. G2P1001 at 4867w1d being seen today for ongoing prenatal care.  She is currently monitored for the following issues for this low-risk pregnancy and has Supervision of low-risk pregnancy; Round ligament pain; Anemia in pregnancy; and Post term pregnancy, antepartum condition or complication on their problem list.  Patient reports no complaints.  Contractions: Not present. Vag. Bleeding: None.  Movement: Present. Denies leaking of fluid.   The following portions of the patient's history were reviewed and updated as appropriate: allergies, current medications, past family history, past medical history, past social history, past surgical history and problem list. Problem list updated.  Objective:   Vitals:   03/21/18 1319  BP: 113/75  Pulse: 74  Weight: 147 lb 6.4 oz (66.9 kg)    Fetal Status: Fetal Heart Rate (bpm): 165 Fundal Height: 40 cm Movement: Present  Presentation: Vertex  General:  Alert, oriented and cooperative. Patient is in no acute distress.  Skin: Skin is warm and dry. No rash noted.   Cardiovascular: Normal heart rate noted  Respiratory: Normal respiratory effort, no problems with respiration noted  Abdomen: Soft, gravid, appropriate for gestational age.  Pain/Pressure: Absent     Pelvic: Cervical exam deferred        Extremities: Normal range of motion.  Edema: None  Mental Status: Normal mood and affect. Normal behavior. Normal judgment and thought content.   Assessment and Plan:  Pregnancy: G2P1001 at 1767w1d  1. Post term pregnancy, antepartum condition or complication  - US FETAL BPP W/NONSTRESS; Future -BPP 8/8 today, -NST: reactive, baseline 145 bpm, moderate variability, no decels. Occasional contractions.   2. Encounter for supervision of low-risk pregnancy in third trimester  -Induction ordered placed, induction scheduled for 7/11 AM.  -Patient to arrive Wednesday to the office for foley bulb  placement.    There are no diagnoses linked to this encounter. Term labor symptoms and general obstetric precautions including but not limited to vaginal bleeding, contractions, leaking of fluid and fetal movement were reviewed in detail with the patient. Please refer to After Visit Summary for other counseling recommendations.  Return in about 5 days (around 03/26/2018), or For folly bulb insertion and NST .  Future Appointments  Date Time Provider Department Center  03/26/2018  2:15 PM [facility] NST [facility]  03/26/2018  3:15 PM Pincus LargePhelps, Jazma Y, DO [facility]  03/27/2018  7:00 AM [facility] ROOM [facility] None    Venia CarbonJennifer Rasch, NP

## 2018-03-24 ENCOUNTER — Telehealth (HOSPITAL_COMMUNITY): Payer: Self-pay | Admitting: *Deleted

## 2018-03-24 ENCOUNTER — Encounter (HOSPITAL_COMMUNITY): Payer: Self-pay | Admitting: *Deleted

## 2018-03-24 ENCOUNTER — Inpatient Hospital Stay (HOSPITAL_COMMUNITY)
Admission: AD | Admit: 2018-03-24 | Discharge: 2018-03-26 | DRG: 806 | Disposition: A | Discharge status: Home / Self Care | Payer: Medicaid Other | Attending: Obstetrics and Gynecology | Admitting: Obstetrics and Gynecology

## 2018-03-24 DIAGNOSIS — Z3493 Encounter for supervision of normal pregnancy, unspecified, third trimester: Secondary | ICD-10-CM

## 2018-03-24 DIAGNOSIS — O99011 Anemia complicating pregnancy, first trimester: Secondary | ICD-10-CM

## 2018-03-24 DIAGNOSIS — O480 Post-term pregnancy: Principal | ICD-10-CM | POA: Diagnosis present

## 2018-03-24 DIAGNOSIS — N949 Unspecified condition associated with female genital organs and menstrual cycle: Secondary | ICD-10-CM

## 2018-03-24 DIAGNOSIS — Z3A40 40 weeks gestation of pregnancy: Secondary | ICD-10-CM

## 2018-03-24 DIAGNOSIS — Z87891 Personal history of nicotine dependence: Secondary | ICD-10-CM | POA: Diagnosis not present

## 2018-03-24 LAB — TYPE AND SCREEN
ABO/RH(D): AB POS
ANTIBODY SCREEN: NEGATIVE

## 2018-03-24 LAB — CBC
HEMATOCRIT: 35.4 % — AB (ref 36.0–46.0)
Hemoglobin: 12 g/dL (ref 12.0–15.0)
MCH: 28.8 pg (ref 26.0–34.0)
MCHC: 33.9 g/dL (ref 30.0–36.0)
MCV: 85.1 fL (ref 78.0–100.0)
Platelets: 136 10*3/uL — ABNORMAL LOW (ref 150–400)
RBC: 4.16 MIL/uL (ref 3.87–5.11)
RDW: 14.5 % (ref 11.5–15.5)
WBC: 11.1 10*3/uL — ABNORMAL HIGH (ref 4.0–10.5)

## 2018-03-24 MED ORDER — OXYTOCIN 40 UNITS IN LACTATED RINGERS INFUSION - SIMPLE MED
INTRAVENOUS | Status: AC
  Administered 2018-03-24: 500 mL via INTRAVENOUS
  Filled 2018-03-24: qty 1000

## 2018-03-24 MED ORDER — OXYCODONE-ACETAMINOPHEN 5-325 MG PO TABS: 1.0000 | ORAL_TABLET | ORAL | Status: DC | PRN

## 2018-03-24 MED ORDER — LACTATED RINGERS IV SOLN: 500.0000 mL | INTRAVENOUS | Status: DC | PRN

## 2018-03-24 MED ORDER — LACTATED RINGERS IV SOLN
INTRAVENOUS | Status: DC
  Administered 2018-03-24: 21:00:00 via INTRAVENOUS

## 2018-03-24 MED ORDER — FLEET ENEMA 7-19 GM/118ML RE ENEM: 1.0000 | ENEMA | RECTAL | Status: DC | PRN

## 2018-03-24 MED ORDER — OXYTOCIN BOLUS FROM INFUSION
500.0000 mL | Freq: Once | INTRAVENOUS | Status: AC
  Administered 2018-03-24: 500 mL via INTRAVENOUS

## 2018-03-24 MED ORDER — OXYTOCIN 40 UNITS IN LACTATED RINGERS INFUSION - SIMPLE MED: 2.5000 [IU]/h | INTRAVENOUS | Status: DC

## 2018-03-24 MED ORDER — LIDOCAINE HCL (PF) 1 % IJ SOLN
30.0000 mL | INTRAMUSCULAR | Status: DC | PRN
  Filled 2018-03-24: qty 30

## 2018-03-24 MED ORDER — OXYCODONE-ACETAMINOPHEN 5-325 MG PO TABS: 2.0000 | ORAL_TABLET | ORAL | Status: DC | PRN

## 2018-03-24 MED ORDER — LIDOCAINE HCL (PF) 1 % IJ SOLN
INTRAMUSCULAR | Status: AC
  Filled 2018-03-24: qty 30

## 2018-03-24 MED ORDER — ONDANSETRON HCL 4 MG/2ML IJ SOLN
4.0000 mg | Freq: Four times a day (QID) | INTRAMUSCULAR | Status: DC | PRN
Start: 2018-03-24 — End: 2018-03-25

## 2018-03-24 MED ORDER — SOD CITRATE-CITRIC ACID 500-334 MG/5ML PO SOLN: 30.0000 mL | ORAL | Status: DC | PRN

## 2018-03-24 MED ORDER — ACETAMINOPHEN 325 MG PO TABS: 650.0000 mg | ORAL_TABLET | ORAL | Status: DC | PRN

## 2018-03-24 NOTE — H&P (Signed)
Obstetric History and Physical  Shannon Trujillo is a 22 y.o. G2P1001 with IUP at [redacted]w[redacted]d presenting for SOL. Patient states she has been having  Regular contractions since 5p today, minimal vaginal bleeding, intact membranes, with active fetal movement.   Prenatal Course Source of Care: [facility]  Dating: By LMP --->  Estimated Date of Delivery: 03/20/18 Pregnancy complications or risks: Patient Active Problem List   Diagnosis Date Noted  . Post term pregnancy, antepartum condition or complication 03/21/2018  . Anemia in pregnancy 02/18/2018  . Supervision of low-risk pregnancy 11-20-17  . Round ligament pain 2017/11/20   She plans to bottle feed She desires IUD for postpartum contraception.   Prenatal labs and studies: ABO, Rh: AB/Positive/-- 11-20-2022 1151) Antibody: Negative 11/20/2022 1151) Rubella: 7.41 11-20-22 1151) RPR: Non Reactive (04/05 0841)  HBsAg: Negative 2022-11-20 1151)  HIV: Non Reactive (04/05 0841)  ZOX:WRUEAVWU (06/04 0000) 2hr Glucola normal Genetic screening declined Anatomy US normal  Prenatal Transfer Tool  Maternal Diabetes: No Genetic Screening: Declined Maternal Ultrasounds/Referrals: Normal Fetal Ultrasounds or other Referrals:  None Maternal Substance Abuse:  No Significant Maternal Medications:  None Significant Maternal Lab Results: None  Past Medical History:  Diagnosis Date  . Medical history non-contributory     Past Surgical History:  Procedure Laterality Date  . NO PAST SURGERIES      OB History  Gravida Para Term Preterm AB Living  2 1 1  0 0 1  SAB TAB Ectopic Multiple Live Births  0 0 0 0 1    # Outcome Date GA Lbr Len/2nd Weight Sex Delivery Anes PTL Lv  2 Current           1 Term 12/03/15 [redacted]w[redacted]d 05:19 / 00:20 2.948 kg (6 lb 8 oz) F Vag-Spont None  LIV     Birth Comments: baby was adopted    Social History   Socioeconomic History  . Marital status: Single    Spouse name: Not on file  . Number of children: Not on file  . Years of  education: Not on file  . Highest education level: Not on file  Occupational History  . Not on file  Social Needs  . Financial resource strain: Not on file  . Food insecurity:    Worry: Not on file    Inability: Not on file  . Transportation needs:    Medical: Not on file    Non-medical: Not on file  Tobacco Use  . Smoking status: Former Games developer  . Smokeless tobacco: Former Engineer, water and Sexual Activity  . Alcohol use: No  . Drug use: No  . Sexual activity: Yes  Lifestyle  . Physical activity:    Days per week: Not on file    Minutes per session: Not on file  . Stress: Not on file  Relationships  . Social connections:    Talks on phone: Not on file    Gets together: Not on file    Attends religious service: Not on file    Active member of club or organization: Not on file    Attends meetings of clubs or organizations: Not on file    Relationship status: Not on file  Other Topics Concern  . Not on file  Social History Narrative  . Not on file    Family History  Problem Relation Age of Onset  . Diabetes Paternal Grandmother     Medications Prior to Admission  Medication Sig Dispense Refill Last Dose  . Prenatal Vit-Fe  Fumarate-FA (PREPLUS) 27-1 MG TABS Take 1 tablet by mouth daily. 30 tablet 9 Taking    No Known Allergies  Review of Systems: Negative except for what is mentioned in HPI.  Physical Exam: BP 125/75 (BP Location: Left Arm)   Pulse 86   Temp 98.2 F (36.8 C) (Oral)   Resp 18   Ht 5\' 5"  (1.651 m)   Wt 67.7 kg (149 lb 4 oz)   LMP 06/13/2017 (Exact Date)   SpO2 100%   BMI 24.84 kg/m  CONSTITUTIONAL: Well-developed, well-nourished female in no acute distress.  HENT:  Normocephalic, atraumatic, External right and left ear normal. Oropharynx is clear and moist EYES: Conjunctivae and EOM are normal. Pupils are equal, round, and reactive to light. No scleral icterus.  NECK: Normal range of motion, supple, no masses SKIN: Skin is warm and dry.  No rash noted. Not diaphoretic. No erythema. No pallor. NEUROLOGIC: Alert and oriented to person, place, and time. Normal reflexes, muscle tone coordination. No cranial nerve deficit noted. PSYCHIATRIC: Normal mood and affect. Normal behavior. Normal judgment and thought content. CARDIOVASCULAR: Normal heart rate noted, regular rhythm RESPIRATORY: Effort and breath sounds normal, no problems with respiration noted ABDOMEN: Soft, nontender, nondistended, gravid. MUSCULOSKELETAL: Normal range of motion. No edema and no tenderness. 2+ distal pulses.  Cervical Exam: 6.5/90/0 Presentation: cephalic FHT:  Baseline rate 110 bpm   Variability moderate  Accelerations present   Decelerations none Contractions: Every 3-5 mins   Pertinent Labs/Studies:   Results for orders placed or performed during the hospital encounter of 03/24/18 (from the past 24 hour(s))  CBC     Status: Abnormal   Collection Time: 03/24/18  8:25 PM  Result Value Ref Range   WBC 11.1 (H) 4.0 - 10.5 K/uL   RBC 4.16 3.87 - 5.11 MIL/uL   Hemoglobin 12.0 12.0 - 15.0 g/dL   HCT 16.135.4 (L) 09.636.0 - 04.546.0 %   MCV 85.1 78.0 - 100.0 fL   MCH 28.8 26.0 - 34.0 pg   MCHC 33.9 30.0 - 36.0 g/dL   RDW 40.914.5 81.111.5 - 91.415.5 %   Platelets 136 (L) 150 - 400 K/uL    Assessment : Shannon Trujillo is a 22 y.o. G2P1001 at 5043w4d being admitted for labor.  Plan: Labor: Expectant management. Analgesia as needed. FWB: Reassuring fetal heart tracing.  GBS negative Delivery plan: Hopeful for vaginal delivery   Caryl AdaJazma Phelps, DO OB Fellow Faculty Practice, [facility] 03/24/2018, 8:55 PM

## 2018-03-24 NOTE — Telephone Encounter (Signed)
Preadmission screen  

## 2018-03-24 NOTE — MAU Note (Signed)
PT SAYS SHE FEELS UC - 3 MIN APART.  PNC- AT CLINIC.   VE 1-2 CM-  2WEEKS AGO.   DENIES HSV AND MRSA.  GBS- NEG

## 2018-03-25 LAB — CBC
HEMATOCRIT: 33.5 % — AB (ref 36.0–46.0)
HEMOGLOBIN: 11.3 g/dL — AB (ref 12.0–15.0)
MCH: 28.6 pg (ref 26.0–34.0)
MCHC: 33.7 g/dL (ref 30.0–36.0)
MCV: 84.8 fL (ref 78.0–100.0)
Platelets: 155 10*3/uL (ref 150–400)
RBC: 3.95 MIL/uL (ref 3.87–5.11)
RDW: 14.6 % (ref 11.5–15.5)
WBC: 18.8 10*3/uL — ABNORMAL HIGH (ref 4.0–10.5)

## 2018-03-25 LAB — RPR: RPR Ser Ql: NONREACTIVE

## 2018-03-25 LAB — ABO/RH: ABO/RH(D): AB POS

## 2018-03-25 MED ORDER — CEFAZOLIN SODIUM-DEXTROSE 2-4 GM/100ML-% IV SOLN
2.0000 g | Freq: Once | INTRAVENOUS | Status: AC
  Administered 2018-03-25: 2 g via INTRAVENOUS
  Filled 2018-03-25: qty 100

## 2018-03-25 MED ORDER — MISOPROSTOL 200 MCG PO TABS: 600.0000 ug | ORAL_TABLET | Freq: Once | ORAL | Status: DC

## 2018-03-25 MED ORDER — ONDANSETRON HCL 4 MG/2ML IJ SOLN: 4.0000 mg | INTRAMUSCULAR | Status: DC | PRN

## 2018-03-25 MED ORDER — BENZOCAINE-MENTHOL 20-0.5 % EX AERO: 1.0000 "application " | INHALATION_SPRAY | CUTANEOUS | Status: DC | PRN

## 2018-03-25 MED ORDER — ONDANSETRON HCL 4 MG PO TABS: 4.0000 mg | ORAL_TABLET | ORAL | Status: DC | PRN

## 2018-03-25 MED ORDER — MISOPROSTOL 200 MCG PO TABS
600.0000 ug | ORAL_TABLET | Freq: Once | ORAL | Status: AC
  Administered 2018-03-25: 600 ug via ORAL

## 2018-03-25 MED ORDER — MISOPROSTOL 200 MCG PO TABS
400.0000 ug | ORAL_TABLET | Freq: Once | ORAL | Status: AC
  Administered 2018-03-25: 400 ug via RECTAL

## 2018-03-25 MED ORDER — ZOLPIDEM TARTRATE 5 MG PO TABS: 5.0000 mg | ORAL_TABLET | Freq: Every evening | ORAL | Status: DC | PRN

## 2018-03-25 MED ORDER — WITCH HAZEL-GLYCERIN EX PADS: 1.0000 "application " | MEDICATED_PAD | CUTANEOUS | Status: DC | PRN

## 2018-03-25 MED ORDER — IBUPROFEN 600 MG PO TABS
600.0000 mg | ORAL_TABLET | Freq: Four times a day (QID) | ORAL | Status: DC
  Administered 2018-03-25 – 2018-03-26 (×4): 600 mg via ORAL
  Filled 2018-03-25 (×5): qty 1

## 2018-03-25 MED ORDER — TETANUS-DIPHTH-ACELL PERTUSSIS 5-2.5-18.5 LF-MCG/0.5 IM SUSP: 0.5000 mL | Freq: Once | INTRAMUSCULAR | Status: DC

## 2018-03-25 MED ORDER — PRENATAL MULTIVITAMIN CH
1.0000 | ORAL_TABLET | Freq: Every day | ORAL | Status: DC
Start: 2018-03-25 — End: 2018-03-26
  Administered 2018-03-25 – 2018-03-26 (×2): 1 via ORAL
  Filled 2018-03-25 (×2): qty 1

## 2018-03-25 MED ORDER — COCONUT OIL OIL: 1.0000 "application " | TOPICAL_OIL | Status: DC | PRN

## 2018-03-25 MED ORDER — DIBUCAINE 1 % RE OINT: 1.0000 "application " | TOPICAL_OINTMENT | RECTAL | Status: DC | PRN

## 2018-03-25 MED ORDER — MISOPROSTOL 200 MCG PO TABS
ORAL_TABLET | ORAL | Status: AC
  Filled 2018-03-25: qty 5

## 2018-03-25 MED ORDER — METHYLERGONOVINE MALEATE 0.2 MG/ML IJ SOLN
0.2000 mg | Freq: Once | INTRAMUSCULAR | Status: AC
  Administered 2018-03-25: 0.2 mg via INTRAMUSCULAR

## 2018-03-25 MED ORDER — SENNOSIDES-DOCUSATE SODIUM 8.6-50 MG PO TABS
2.0000 | ORAL_TABLET | ORAL | Status: DC
  Filled 2018-03-25: qty 2

## 2018-03-25 MED ORDER — ACETAMINOPHEN 325 MG PO TABS
650.0000 mg | ORAL_TABLET | ORAL | Status: DC | PRN
  Administered 2018-03-25: 650 mg via ORAL
  Filled 2018-03-25: qty 2

## 2018-03-25 MED ORDER — DIPHENHYDRAMINE HCL 25 MG PO CAPS: 25.0000 mg | ORAL_CAPSULE | Freq: Four times a day (QID) | ORAL | Status: DC | PRN

## 2018-03-25 MED ORDER — SIMETHICONE 80 MG PO CHEW: 80.0000 mg | CHEWABLE_TABLET | ORAL | Status: DC | PRN

## 2018-03-25 NOTE — Progress Notes (Signed)
POSTPARTUM PROGRESS NOTE  Post Partum Day #1 Subjective:  Shannon Trujillo is a 22 y.o. [id number] 4959w4d s/p SVD following SOL followed by AROM at 2044 on 03/24/18 complicated by postpartum hemorrhage.  No acute events overnight.  Pt denies problems with ambulating but has only been able to walk a short distance due to catheter. Pt has not voided on her own yet due to recent removal of catheter. Denies problems with po intake.  She denies nausea or vomiting.  Pain is well controlled. Lochia Moderate, bleeding has been steadily decreasing since methergine and cytotec dosing overnight.   Objective: Blood pressure 113/77, pulse 87, temperature 98.4 F (36.9 C), temperature source Oral, resp. rate 18, height 5\' 5"  (1.651 m), weight 67.7 kg (149 lb 4 oz), last menstrual period 06/13/2017, SpO2 100 %, unknown if currently breastfeeding. Urine output over past 12 hours: 500 ml  Physical Exam:  General: alert, cooperative and no distress Lochia:normal flow Chest: no respiratory distress Heart:regular rate, distal pulses intact Abdomen: soft, nontender Uterine Fundus: firm, appropriately tender DVT Evaluation: No calf swelling or tenderness Extremities: No edema  Recent Labs    03/24/18 2025 03/25/18 0204  HGB 12.0 11.3*  HCT 35.4* 33.5*    Assessment/Plan:  ASSESSMENT: Shannon Trujillo is a 22 y.o. [id number] 5859w4d s/p SVD following AROM complicated by post partum hemorrhage.  Plan for discharge tomorrow and Contraception IUD at follow-up visit, will also repeat CBC to monitor for anemia following PPH.    LOS: 1 day   Rolland BimlerLexi J Lance Galas MS3 03/25/2018, 7:41 AM

## 2018-03-25 NOTE — Progress Notes (Signed)
Called Dr. Doroteo GlassmanPhelps regarding patient's temp of 100.5. MD stated the increase is due to cytotec and will take 3 hours to come down.

## 2018-03-25 NOTE — Progress Notes (Signed)
Called to patient room due to increased bleeding post delivery. Patient with QEBL of ~105700mL. RN was able to expel some large clots. Able to sweep uterus and expel another clot. Patient given methergine x1 and Cytotec 1000mg  with improvement in bleeding. Will monitor and PO hydrate. CBC ordered. VSS remained stable. Foley catheter placed due to some dizziness when trying to ambulate to bathroom. Plan to d/c in 6 hours. Will give Ancef 2g x1 due to uterine sweeping.   Shannon AdaJazma Phelps, DO OB Fellow Center for [facility], [facility]

## 2018-03-25 NOTE — Progress Notes (Signed)
Rn called Dr. Doroteo GlassmanPhelps regarding patietn's output of 200 for 6 hours. MD stated to leave IV in have patient drink plenty of fluids.  Blood loss for postpartum hemorrage totalled 1519 ml.

## 2018-03-26 ENCOUNTER — Encounter: Payer: Self-pay | Admitting: Obstetrics and Gynecology

## 2018-03-26 ENCOUNTER — Other Ambulatory Visit: Payer: Self-pay

## 2018-03-26 LAB — BIRTH TISSUE RECOVERY COLLECTION (PLACENTA DONATION)

## 2018-03-26 MED ORDER — FERROUS FUMARATE 325 (106 FE) MG PO TABS: 1.0000 | ORAL_TABLET | Freq: Two times a day (BID) | ORAL | 1 refills | Status: DC

## 2018-03-26 MED ORDER — IBUPROFEN 600 MG PO TABS: 600.0000 mg | ORAL_TABLET | Freq: Four times a day (QID) | ORAL | 0 refills | Status: DC | PRN

## 2018-03-26 NOTE — Discharge Instructions (Signed)
Vaginal Delivery, Care After °Refer to this sheet in the next few weeks. These instructions provide you with information about caring for yourself after vaginal delivery. Your health care provider may also give you more specific instructions. Your treatment has been planned according to current medical practices, but problems sometimes occur. Call your health care provider if you have any problems or questions. °What can I expect after the procedure? °After vaginal delivery, it is common to have: °· Some bleeding from your vagina. °· Soreness in your abdomen, your vagina, and the area of skin between your vaginal opening and your anus (perineum). °· Pelvic cramps. °· Fatigue. ° °Follow these instructions at home: °Medicines °· Take over-the-counter and prescription medicines only as told by your health care provider. °· If you were prescribed an antibiotic medicine, take it as told by your health care provider. Do not stop taking the antibiotic until it is finished. °Driving ° °· Do not drive or operate heavy machinery while taking prescription pain medicine. °· Do not drive for 24 hours if you received a sedative. °Lifestyle °· Do not drink alcohol. This is especially important if you are breastfeeding or taking medicine to relieve pain. °· Do not use tobacco products, including cigarettes, chewing tobacco, or e-cigarettes. If you need help quitting, ask your health care provider. °Eating and drinking °· Drink at least 8 eight-ounce glasses of water every day unless you are told not to by your health care provider. If you choose to breastfeed your baby, you may need to drink more water than this. °· Eat high-fiber foods every day. These foods may help prevent or relieve constipation. High-fiber foods include: °? Whole grain cereals and breads. °? Brown rice. °? Beans. °? Fresh fruits and vegetables. °Activity °· Return to your normal activities as told by your health care provider. Ask your health care provider  what activities are safe for you. °· Rest as much as possible. Try to rest or take a nap when your baby is sleeping. °· Do not lift anything that is heavier than your baby or 10 lb (4.5 kg) until your health care provider says that it is safe. °· Talk with your health care provider about when you can engage in sexual activity. This may depend on your: °? Risk of infection. °? Rate of healing. °? Comfort and desire to engage in sexual activity. °Vaginal Care °· If you have an episiotomy or a vaginal tear, check the area every day for signs of infection. Check for: °? More redness, swelling, or pain. °? More fluid or blood. °? Warmth. °? Pus or a bad smell. °· Do not use tampons or douches until your health care provider says this is safe. °· Watch for any blood clots that may pass from your vagina. These may look like clumps of dark red, brown, or black discharge. °General instructions °· Keep your perineum clean and dry as told by your health care provider. °· Wear loose, comfortable clothing. °· Wipe from front to back when you use the toilet. °· Ask your health care provider if you can shower or take a bath. If you had an episiotomy or a perineal tear during labor and delivery, your health care provider may tell you not to take baths for a certain length of time. °· Wear a bra that supports your breasts and fits you well. °· If possible, have someone help you with household activities and help care for your baby for at least a few days after   you leave the hospital. °· Keep all follow-up visits for you and your baby as told by your health care provider. This is important. °Contact a health care provider if: °· You have: °? Vaginal discharge that has a bad smell. °? Difficulty urinating. °? Pain when urinating. °? A sudden increase or decrease in the frequency of your bowel movements. °? More redness, swelling, or pain around your episiotomy or vaginal tear. °? More fluid or blood coming from your episiotomy or  vaginal tear. °? Pus or a bad smell coming from your episiotomy or vaginal tear. °? A fever. °? A rash. °? Little or no interest in activities you used to enjoy. °? Questions about caring for yourself or your baby. °· Your episiotomy or vaginal tear feels warm to the touch. °· Your episiotomy or vaginal tear is separating or does not appear to be healing. °· Your breasts are painful, hard, or turn red. °· You feel unusually sad or worried. °· You feel nauseous or you vomit. °· You pass large blood clots from your vagina. If you pass a blood clot from your vagina, save it to show to your health care provider. Do not flush blood clots down the toilet without having your health care provider look at them. °· You urinate more than usual. °· You are dizzy or light-headed. °· You have not breastfed at all and you have not had a menstrual period for 12 weeks after delivery. °· You have stopped breastfeeding and you have not had a menstrual period for 12 weeks after you stopped breastfeeding. °Get help right away if: °· You have: °? Pain that does not go away or does not get better with medicine. °? Chest pain. °? Difficulty breathing. °? Blurred vision or spots in your vision. °? Thoughts about hurting yourself or your baby. °· You develop pain in your abdomen or in one of your legs. °· You develop a severe headache. °· You faint. °· You bleed from your vagina so much that you fill two sanitary pads in one hour. °This information is not intended to replace advice given to you by your health care provider. Make sure you discuss any questions you have with your health care provider. °Document Released: 08/31/2000 Document Revised: 02/15/2016 Document Reviewed: 09/18/2015 °Elsevier Interactive Patient Education © 2018 [employer] ° °

## 2018-03-26 NOTE — Discharge Summary (Signed)
OB Discharge Summary     Patient Name: Shannon Trujillo DOB: 08/06/1996 MRN: [id number]  Date of admission: 03/24/2018 Delivering MD: Pincus Large   Date of discharge: 03/26/2018  Admitting diagnosis: 40 WKS, CTX  Intrauterine pregnancy: [redacted]w[redacted]d     Secondary diagnosis:  Active Problems:   Normal labor   SVD (spontaneous vaginal delivery)   Postpartum hemorrhage  Additional problems: GBS neg     Discharge diagnosis: Term Pregnancy Delivered and PPH (postpartum hemorrhage)                                                                                          Post partum procedures:none  Augmentation: AROM  Complications: Hemorrhage>1046mL  Hospital course:  Onset of Labor With Vaginal Delivery     22 y.o. yo [id number] at [redacted]w[redacted]d was admitted in Active Labor on 03/24/2018. Patient had an uncomplicated labor course as follows:  Membrane Rupture Time/Date: 8:44 PM ,03/24/2018   Intrapartum Procedures: Episiotomy: None [1]                                         Lacerations:  None [1]  Patient had a delivery of a Viable infant on 03/24/2018 after a precipitous labor. Approx 1-2 hrs after delivery she had a hemorrhage with more than 1000cc of blood/clots. She was tx with methergine and cytotec. CBC ordered at that time showed hgb at 11.3 (was 12.0 on admission). The total EBL was 1619cc (100 at delivery, then a total of 1519 during the delayed hemorrhage).   By PPD#2 she is ambulating, tolerating a regular diet, passing flatus, and urinating well. Patient is discharged home in stable condition on 03/26/18. She denies dizziness or complications with ambulation.   Physical exam  Vitals:   03/25/18 0820 03/25/18 1335 03/25/18 2212 03/26/18 0628  BP: 107/74 102/67 102/69 102/64  Pulse: 77 60 71 68  Resp: 18 16 18 18   Temp: 98.4 F (36.9 C) 98.1 F (36.7 C) 97.8 F (36.6 C)   TempSrc: Oral Oral Oral   SpO2:   100%   Weight:      Height:       General: alert and cooperative Lochia:  appropriate Uterine Fundus: firm Incision: N/A DVT Evaluation: No evidence of DVT seen on physical exam. Labs: Lab Results  Component Value Date   WBC 18.8 (H) 03/25/2018   HGB 11.3 (L) 03/25/2018   HCT 33.5 (L) 03/25/2018   MCV 84.8 03/25/2018   PLT 155 03/25/2018   CMP Latest Ref Rng & Units 12/03/2015  Glucose 65 - 99 mg/dL 93  BUN 6 - 20 mg/dL 6  Creatinine 1.47 - 8.29 mg/dL 5.62  Sodium 130 - 865 mmol/L 135  Potassium 3.5 - 5.1 mmol/L 3.9  Chloride 101 - 111 mmol/L 107  CO2 22 - 32 mmol/L 19(L)  Calcium 8.9 - 10.3 mg/dL 7.8(I)  Total Protein 6.5 - 8.1 g/dL 6.5  Total Bilirubin 0.3 - 1.2 mg/dL 0.9  Alkaline Phos 47 - 119 U/L 213(H)  AST 15 -  41 U/L 19  ALT 14 - 54 U/L 12(L)    Discharge instruction: per After Visit Summary and "Baby and Me Booklet".  After visit meds:  Allergies as of 03/26/2018   No Known Allergies     Medication List    TAKE these medications   ferrous fumarate 325 (106 Fe) MG Tabs tablet Commonly known as:  HEMOCYTE - 106 mg FE Take 1 tablet (106 mg of iron total) by mouth 2 (two) times daily.   ibuprofen 600 MG tablet Commonly known as:  ADVIL,MOTRIN Take 1 tablet (600 mg total) by mouth every 6 (six) hours as needed.   PREPLUS 27-1 MG Tabs Take 1 tablet by mouth daily.       Diet: routine diet  Activity: Advance as tolerated. Pelvic rest for 6 weeks.   Outpatient follow up:4 weeks Follow up Appt:No future appointments. Follow up Visit:No follow-ups on file.  Postpartum contraception: IUD Mirena  Newborn Data: Live born female  Birth Weight:   APGAR: 9, 9  Newborn Delivery   Birth date/time:  03/24/2018 21:08:00 Delivery type:  Vaginal, Spontaneous     Baby Feeding: Bottle Disposition:home with mother   03/26/2018 Cam HaiSHAW, Shauniece Kwan, CNM  9:23 AM

## 2018-03-27 ENCOUNTER — Inpatient Hospital Stay (HOSPITAL_COMMUNITY): Admission: RE | Admit: 2018-03-27 | Payer: Medicaid Other | Source: Ambulatory Visit

## 2018-04-06 ENCOUNTER — Encounter: Payer: Self-pay | Admitting: Obstetrics and Gynecology

## 2018-04-07 ENCOUNTER — Encounter: Payer: Self-pay | Admitting: Obstetrics and Gynecology

## 2018-04-22 ENCOUNTER — Encounter (HOSPITAL_COMMUNITY): Payer: Self-pay | Admitting: *Deleted

## 2018-04-28 ENCOUNTER — Ambulatory Visit (INDEPENDENT_AMBULATORY_CARE_PROVIDER_SITE_OTHER): Payer: Medicaid Other | Admitting: Advanced Practice Midwife

## 2018-04-28 ENCOUNTER — Encounter: Payer: Self-pay | Admitting: Advanced Practice Midwife

## 2018-04-28 DIAGNOSIS — Z1389 Encounter for screening for other disorder: Secondary | ICD-10-CM | POA: Diagnosis not present

## 2018-04-28 DIAGNOSIS — Z30430 Encounter for insertion of intrauterine contraceptive device: Secondary | ICD-10-CM

## 2018-04-28 LAB — POCT PREGNANCY, URINE: Preg Test, Ur: NEGATIVE

## 2018-04-28 MED ORDER — LEVONORGESTREL 19.5 MCG/DAY IU IUD
INTRAUTERINE_SYSTEM | Freq: Once | INTRAUTERINE | Status: AC
  Administered 2018-04-28: 17:00:00 via INTRAUTERINE

## 2018-04-28 NOTE — Progress Notes (Signed)
Subjective:     Shannon Trujillo is a 22 y.o. female G2P2002 who presents for a postpartum visit. She is 5 weeks postpartum following a spontaneous vaginal delivery. I have fully reviewed the prenatal and intrapartum course. The delivery was at 3840w4days gestational weeks. Outcome: spontaneous vaginal delivery. Anesthesia: none. Postpartum course has been complicated with postpartum hemorrhage but has done well since discharge. Baby's course has been uncomplicated. Baby is feeding by bottle - Gerber Gentle. States sleeping well, mood is good.  Bleeding no bleeding. Bowel function is normal. Bladder function is normal. Patient is sexually active. Contraception method is IUD. Postpartum depression screening: negative.  The following portions of the patient's history were reviewed and updated as appropriate: allergies, current medications, past family history, past medical history, past social history, past surgical history and problem list.  Review of Systems Pertinent items are noted in HPI.   Objective:    BP 111/69   Pulse 69   Wt 132 lb 8 oz (60.1 kg)   LMP 06/13/2017 (Exact Date)   Breastfeeding? No Comment: Gerber Gentle  BMI 22.05 kg/m   General:  alert, cooperative and appears stated age   Breasts:  deferred  Lungs: no respiratory distress  Heart:  normal heart rate  Abdomen: soft, non-tender   Vulva:  normal  Vagina: normal vagina, no discharge, exudate, lesion, or erythema  Cervix:  anteverted  Corpus: normal  Adnexa:  no mass, fullness, tenderness  Rectal Exam: Not performed.        Assessment:   Normal postpartum exam. Pap smear not indicated (re: age)   Plan:   1. Contraception: IUD (Liletta placed today, see procedure note below)   2. Anemia: HgB was stable at hospital discharge, no need to recheck given no longer bleeding.  3. Follow up in: 4 weeks or as needed for string check.   IUD Insertion Procedure Note Patient identified, informed consent performed, consent  signed.   Discussed risks of irregular bleeding, cramping, infection, malpositioning or misplacement of the IUD outside the uterus which may require further procedure such as laparoscopy. Time out was performed.  Urine pregnancy test negative.  Speculum placed in the vagina.  Cervix visualized.  Cleaned with Betadine x 2.  Grasped anteriorly with a single tooth tenaculum.  Uterus sounded to 6 cm.  Liletta IUD placed per manufacturer's recommendations.  Strings trimmed to 3 cm. Tenaculum was removed, good hemostasis noted.  Patient tolerated procedure well.   Patient was given post-procedure instructions.  She was advised to have backup contraception for one week.  Patient was also asked to check IUD strings periodically and follow up in 4 weeks for IUD check.

## 2018-04-28 NOTE — Patient Instructions (Signed)

## 2018-06-02 ENCOUNTER — Encounter: Payer: Self-pay | Admitting: Advanced Practice Midwife

## 2018-06-02 ENCOUNTER — Ambulatory Visit (INDEPENDENT_AMBULATORY_CARE_PROVIDER_SITE_OTHER): Payer: Medicaid Other | Admitting: Advanced Practice Midwife

## 2018-06-02 VITALS — BP 133/73 | HR 105 | Wt 127.2 lb

## 2018-06-02 DIAGNOSIS — Z30431 Encounter for routine checking of intrauterine contraceptive device: Secondary | ICD-10-CM | POA: Diagnosis not present

## 2018-06-02 NOTE — Patient Instructions (Signed)

## 2018-06-02 NOTE — Progress Notes (Signed)
GYNECOLOGY OFFICE PROGRESS NOTE  History:  22 y.o. [id number] here today for today for IUD string check; liletta IUD was placed 04/28/18. No complaints about the IUD, no concerning side effects.  The following portions of the patient's history were reviewed and updated as appropriate: allergies, current medications, past family history, past medical history, past social history, past surgical history and problem list. Last pap smear on NA-Age.  Review of Systems:  Pertinent items are noted in HPI.   Objective:  Physical Exam Blood pressure 133/73, pulse (!) 105, weight 127 lb 3.2 oz (57.7 kg), not currently breastfeeding. CONSTITUTIONAL: Well-developed, well-nourished female in no acute distress.  HENT:  Normocephalic, atraumatic. External right and left ear normal. Oropharynx is clear and moist EYES: Conjunctivae and EOM are normal. Pupils are equal, round, and reactive to light. No scleral icterus.  NECK: Normal range of motion, supple, no masses CARDIOVASCULAR: Normal heart rate noted RESPIRATORY: Effort and breath sounds normal, no problems with respiration noted ABDOMEN: Soft, no distention noted.   PELVIC: Normal appearing external genitalia; normal appearing vaginal mucosa and cervix.  IUD strings visualized, about 3 cm in length outside cervix.   Assessment & Plan:  Normal IUD check. Patient to keep IUD in place for five years; can come in for removal if she desires pregnancy within the next five years. Routine preventative health maintenance measures emphasized.  Thressa ShellerHeather Hogan 10:58 AM 06/02/18

## 2018-08-21 ENCOUNTER — Ambulatory Visit (INDEPENDENT_AMBULATORY_CARE_PROVIDER_SITE_OTHER): Payer: Medicaid Other | Admitting: Nurse Practitioner

## 2018-08-21 ENCOUNTER — Encounter: Payer: Self-pay | Admitting: Nurse Practitioner

## 2018-08-21 VITALS — BP 119/73 | HR 75 | Ht 65.0 in | Wt 125.9 lb

## 2018-08-21 DIAGNOSIS — Z975 Presence of (intrauterine) contraceptive device: Secondary | ICD-10-CM | POA: Insufficient documentation

## 2018-08-21 NOTE — Progress Notes (Signed)
    GYNECOLOGY OFFICE ENCOUNTER NOTE  History:  22 y.o. [id number] here today for today for IUD string check; Liletta  IUD was placed  04-28-18. Felt the string yesterday - something plastic and was concerned the IUD had come out.  Did not have any cramping or bleeding.  The following portions of the patient's history were reviewed and updated as appropriate: allergies, current medications, past family history, past medical history, past social history, past surgical history.  No pap done per clients age it is Not Indicated.  Review of Systems:  Pertinent items are noted in HPI.   Objective:  Physical Exam Blood pressure 119/73, pulse 75, height 5\' 5"  (1.651 m), weight 125 lb 14.4 oz (57.1 kg), not currently breastfeeding. CONSTITUTIONAL: Well-developed, well-nourished female in no acute distress.  HENT:  Normocephalic, atraumatic. External right and left ear normal. Oropharynx is clear and moist EYES: Conjunctivae and EOM are normal. Pupils are equal, round, and reactive to light. No scleral icterus.  NECK: Normal range of motion, supple, no masses CARDIOVASCULAR: Normal heart rate noted RESPIRATORY: Effort and breath sounds normal, no problems with respiration noted ABDOMEN: Soft, no distention noted.   PELVIC: Normal appearing external genitalia; normal appearing vaginal mucosa and cervix.  IUD strings visualized, about 3 cm in length outside cervix. Did not trim strings as they could wrap behind the cervix.  Assessment & Plan:  Patient to keep IUD in place for up to seven years; can come in for removal if she desires pregnancy earlier or for or concerning side effects. Instructed to take over the counter ibuprofen by the package directions and with food if there is any problem with cramping with the IUD. Expect to have light menses or no menses - both are normal. Return in August next year for annual exam.  Nolene BernheimERRI BURLESON, RN, MSN, NP-BC Nurse Practitioner, [facility]  [facility], [facility] 08/21/2018 6:05 PM

## 2018-08-21 NOTE — Progress Notes (Signed)
States yesterday when using restroom and wiping felt tissue get caught  On something and could something hard. Today does not feel that. Wants to see if IUD is still there.

## 2019-01-29 ENCOUNTER — Encounter: Payer: Self-pay | Admitting: *Deleted

## 2019-12-17 ENCOUNTER — Other Ambulatory Visit: Payer: Self-pay

## 2019-12-17 ENCOUNTER — Emergency Department (HOSPITAL_BASED_OUTPATIENT_CLINIC_OR_DEPARTMENT_OTHER): Payer: Medicaid Other

## 2019-12-17 ENCOUNTER — Emergency Department (HOSPITAL_BASED_OUTPATIENT_CLINIC_OR_DEPARTMENT_OTHER)
Admission: EM | Admit: 2019-12-17 | Discharge: 2019-12-17 | Disposition: A | Discharge status: Home / Self Care | Payer: Medicaid Other | Attending: Emergency Medicine | Admitting: Emergency Medicine

## 2019-12-17 ENCOUNTER — Encounter (HOSPITAL_BASED_OUTPATIENT_CLINIC_OR_DEPARTMENT_OTHER): Payer: Self-pay

## 2019-12-17 DIAGNOSIS — M79642 Pain in left hand: Secondary | ICD-10-CM | POA: Diagnosis not present

## 2019-12-17 DIAGNOSIS — Y9389 Activity, other specified: Secondary | ICD-10-CM | POA: Diagnosis not present

## 2019-12-17 DIAGNOSIS — Y929 Unspecified place or not applicable: Secondary | ICD-10-CM | POA: Insufficient documentation

## 2019-12-17 DIAGNOSIS — Z87891 Personal history of nicotine dependence: Secondary | ICD-10-CM | POA: Diagnosis not present

## 2019-12-17 DIAGNOSIS — W010XXA Fall on same level from slipping, tripping and stumbling without subsequent striking against object, initial encounter: Secondary | ICD-10-CM | POA: Diagnosis not present

## 2019-12-17 DIAGNOSIS — W19XXXA Unspecified fall, initial encounter: Secondary | ICD-10-CM

## 2019-12-17 DIAGNOSIS — M79641 Pain in right hand: Secondary | ICD-10-CM | POA: Insufficient documentation

## 2019-12-17 DIAGNOSIS — Y998 Other external cause status: Secondary | ICD-10-CM | POA: Insufficient documentation

## 2019-12-17 MED ORDER — NAPROXEN 500 MG PO TABS: 500.0000 mg | ORAL_TABLET | Freq: Two times a day (BID) | ORAL | 0 refills | Status: DC

## 2019-12-17 NOTE — Discharge Instructions (Addendum)
Please read and follow all provided instructions.  You have been seen today for hand pain following a fall. An xray of each of your hand does not show any fractures.  As discussed there is concern for possible occult fracture (a discrete fracture missed on initial imaging at times) which is why we have placed you in braces. Please wear the left wrist brace specifically at all time and the right wrist brace as well if possible. Follow up with Dr. Arita Miss (plastic/hand surgeon) within 3 days for re-evaluation.   Home care instructions: -- *PRICE in the first 24-48 hours after injury: Protect with braces Rest Ice- Do not apply ice pack directly to your skin, place towel or similar between your skin and ice/ice pack. Apply ice for 20 min, then remove for 40 min while awake Compression- Wear brace, elastic bandage, splint as directed by your provider Elevate affected extremity above the level of your heart when not walking around for the first 24-48 hours   Medications:  - Naproxen is a nonsteroidal anti-inflammatory medication that will help with pain and swelling. Be sure to take this medication as prescribed with food, 1 pill every 12 hours,  It should be taken with food, as it can cause stomach upset, and more seriously, stomach bleeding. Do not take other nonsteroidal anti-inflammatory medications with this such as Advil, Motrin, Aleve, Mobic, Goodie Powder, or Motrin.    You make take Tylenol per over the counter dosing with these medications.   We have prescribed you new medication(s) today. Discuss the medications prescribed today with your pharmacist as they can have adverse effects and interactions with your other medicines including over the counter and prescribed medications. Seek medical evaluation if you start to experience new or abnormal symptoms after taking one of these medicines, seek care immediately if you start to experience difficulty breathing, feeling of your throat closing, facial  swelling, or rash as these could be indications of a more serious allergic reaction   Follow-up instructions: Please follow-up with Dr. Arita Miss within 3 days for re-evaluation.   Return instructions:  Please return if your digits or extremity are numb or tingling, appear gray or blue, or you have severe pain (also elevate the extremity and loosen splint or wrap if you were given one) Please return if you have redness or fevers.  Please return to the Emergency Department if you experience worsening symptoms.  Please return if you have any other emergent concerns. Additional Information:  Your vital signs today were: BP 137/88   Pulse 87   Temp (!) 97.5 F (36.4 C) (Oral)   Resp 20   Ht 5\' 5"  (1.651 m)   Wt 56.7 kg   SpO2 100%   BMI 20.80 kg/m  If your blood pressure (BP) was elevated above 135/85 this visit, please have this repeated by your doctor within one month. ---------------

## 2019-12-17 NOTE — ED Provider Notes (Signed)
[facility]PARTMENT Provider Note   CSN: [id number] Arrival date & time: 12/17/19  2047     History Chief Complaint  Patient presents with  . Hand Injury    Shannon Trujillo is a 24 y.o. female with a history of anemia who presents to the ED with complaints of bilateral hand pain status post fall 2 days prior.  Patient states that she slipped on a wet rock and caught herself with the palms of both hands.  She states she has been having pain to this area ever since, left hand greater than right hand, currently a 5 out of 10 in severity, improved from an 8 out of 10 in severity status post ibuprofen prior to arrival.  Reports some associated bruising.  Denies numbness, tingling, weakness, or open wounds.  Patient is left-hand dominant.  Denies chance of pregnancy.  HPI     Past Medical History:  Diagnosis Date  . Anemia in pregnancy 02/18/2018  . Medical history non-contributory     Patient Active Problem List   Diagnosis Date Noted  . IUD (intrauterine device) in place 08/21/2018    Past Surgical History:  Procedure Laterality Date  . NO PAST SURGERIES       OB History    Gravida  2   Para  2   Term  2   Preterm  0   AB  0   Living  2     SAB  0   TAB  0   Ectopic  0   Multiple  0   Live Births  2           Family History  Problem Relation Age of Onset  . Diabetes Paternal Grandmother   . Anemia Mother     Social History   Tobacco Use  . Smoking status: Former Smoker    Types: E-cigarettes  . Smokeless tobacco: Former Network engineer Use Topics  . Alcohol use: No  . Drug use: No    Home Medications Prior to Admission medications   Not on File    Allergies    Patient has no known allergies.  Review of Systems   Review of Systems  Constitutional: Negative for chills and fever.  Respiratory: Negative for shortness of breath.   Cardiovascular: Negative for chest pain.  Gastrointestinal: Negative for abdominal  pain.  Musculoskeletal: Positive for arthralgias. Negative for back pain, gait problem and myalgias.  Skin: Positive for color change (bruising). Negative for wound.  Neurological: Negative for weakness and numbness.    Physical Exam Updated Vital Signs BP 137/88   Pulse 87   Temp (!) 97.5 F (36.4 C) (Oral)   Resp 20   Ht 5\' 5"  (1.651 m)   Wt 56.7 kg   SpO2 100%   BMI 20.80 kg/m   Physical Exam Vitals and nursing note reviewed.  Constitutional:      General: She is not in acute distress.    Appearance: Normal appearance. She is not ill-appearing or toxic-appearing.  HENT:     Head: Normocephalic and atraumatic.  Neck:     Comments: No midline tenderness.  Cardiovascular:     Rate and Rhythm: Normal rate.     Pulses:          Radial pulses are 2+ on the right side and 2+ on the left side.  Pulmonary:     Effort: No respiratory distress.     Breath sounds: Normal breath sounds.  Musculoskeletal:     Cervical back: Normal range of motion and neck supple.     Comments: Upper extremities: Patient has bruising noted to the bilateral thenar eminences which extends to the ventral potion of the wrists, left greater than right.  She is tender to palpation over the thenar eminences bilaterally, left greater than right.  Her upper extremities are otherwise nontender.  She specifically has no anatomical snuffbox tenderness to palpation.  She is neurovascularly intact distally. Back: No midline tenderness. Lower extremities: Nontender.  Skin:    General: Skin is warm and dry.     Capillary Refill: Capillary refill takes less than 2 seconds.  Neurological:     Mental Status: She is alert.     Comments: Alert. Clear speech. Sensation grossly intact to bilateral upper extremities. 5/5 symmetric grip strength. Ambulatory.  Able to perform okay sign, thumbs up, cross second/third digits bilaterally.  Psychiatric:        Mood and Affect: Mood normal.        Behavior: Behavior normal.      ED Results / Procedures / Treatments   Labs (all labs ordered are listed, but only abnormal results are displayed) Labs Reviewed - No data to display  EKG None  Radiology DG Hand Complete Left  Result Date: 12/17/2019 CLINICAL DATA:  Pain EXAM: LEFT HAND - COMPLETE 3+ VIEW COMPARISON:  None. FINDINGS: There is no evidence of fracture or dislocation. There is no evidence of arthropathy or other focal bone abnormality. Soft tissues are unremarkable. IMPRESSION: Negative. Electronically Signed   By: Katherine Mantle M.D.   On: 12/17/2019 21:35   DG Hand Complete Right  Result Date: 12/17/2019 CLINICAL DATA:  Pain EXAM: RIGHT HAND - COMPLETE 3+ VIEW COMPARISON:  None. FINDINGS: There is no evidence of fracture or dislocation. There is no evidence of arthropathy or other focal bone abnormality. Soft tissues are unremarkable. IMPRESSION: Negative. Electronically Signed   By: Katherine Mantle M.D.   On: 12/17/2019 21:35    Procedures Procedures (including critical care time)  Medications Ordered in ED Medications - No data to display  ED Course  I have reviewed the triage vital signs and the nursing notes.  Pertinent labs & imaging results that were available during my care of the patient were reviewed by me and considered in my medical decision making (see chart for details).    MDM Rules/Calculators/A&P                      Patient presents to the ED with complaints of bilateral hand pain s/p fall 2 days prior.  Nontoxic, vitals without significant abnormality. Bruising and tenderness to palpation noted to the thenar eminence bilaterally. No open wounds. NVI distally. Xrays negative for fracture.  She has no anatomical snuffbox tenderness therefore feel occult scaphoid fracture would be less likely, however she does have tenderness to the thenar eminence bilaterally. Discussed options of care with the patient- ultimately opted for bilateral thumb spica braces out of precaution  with close hand surgery follow up. Naproxen to help with pain/swelling. I discussed results, treatment plan, need for follow-up, and return precautions with the patient. Provided opportunity for questions, patient confirmed understanding and is in agreement with plan.   Findings and plan of care discussed with supervising physician Dr. Stevie Kern who is in agreement.   Final Clinical Impression(s) / ED Diagnoses Final diagnoses:  Fall, initial encounter  Bilateral hand pain    Rx / DC Orders ED  Discharge Orders         Ordered    naproxen (NAPROSYN) 500 MG tablet  2 times daily     12/17/19 2217           Cherly Anderson, PA-C 12/17/19 2220    Milagros Loll, MD 12/19/19 0004

## 2019-12-17 NOTE — ED Triage Notes (Signed)
Pt reports she was hiking on 3/30 when she slipped on a wet rock and caught herself with her hands. Today pt c/o pain and bruising in bilateral hands L>R.

## 2020-06-03 DIAGNOSIS — S0181XA Laceration without foreign body of other part of head, initial encounter: Secondary | ICD-10-CM | POA: Insufficient documentation

## 2020-06-03 DIAGNOSIS — Y92410 Unspecified street and highway as the place of occurrence of the external cause: Secondary | ICD-10-CM | POA: Insufficient documentation

## 2020-06-03 DIAGNOSIS — Z87891 Personal history of nicotine dependence: Secondary | ICD-10-CM | POA: Insufficient documentation

## 2020-06-03 DIAGNOSIS — Y9389 Activity, other specified: Secondary | ICD-10-CM | POA: Insufficient documentation

## 2020-06-04 ENCOUNTER — Encounter (HOSPITAL_COMMUNITY): Payer: Self-pay | Admitting: Emergency Medicine

## 2020-06-04 ENCOUNTER — Emergency Department (HOSPITAL_COMMUNITY): Payer: Medicaid Other

## 2020-06-04 ENCOUNTER — Emergency Department (HOSPITAL_COMMUNITY)
Admission: EM | Admit: 2020-06-04 | Discharge: 2020-06-04 | Disposition: A | Discharge status: Home / Self Care | Payer: Medicaid Other | Attending: Emergency Medicine | Admitting: Emergency Medicine

## 2020-06-04 ENCOUNTER — Other Ambulatory Visit: Payer: Self-pay

## 2020-06-04 DIAGNOSIS — S0181XA Laceration without foreign body of other part of head, initial encounter: Secondary | ICD-10-CM

## 2020-06-04 MED ORDER — LIDOCAINE HCL (PF) 1 % IJ SOLN
5.0000 mL | Freq: Once | INTRAMUSCULAR | Status: AC
  Administered 2020-06-04: 5 mL
  Filled 2020-06-04: qty 5

## 2020-06-04 NOTE — Discharge Instructions (Addendum)
You were evaluated in the Emergency Department and after careful evaluation, we did not find any emergent condition requiring admission or further testing in the hospital.  Your exam/testing today is overall reassuring.  Your CT scans did not show any significant injuries.  We repaired your laceration here in the emergency department with sutures.  These will need to be removed in 3 to 5 days by a healthcare professional.  After suture removal, we recommend Neosporin on the wound daily to help with scarring.  Please return to the Emergency Department if you experience any worsening of your condition.   Thank you for allowing [place] to be a part of your care.
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
## 2020-06-04 NOTE — ED Notes (Signed)
Patient verbalizes understanding of discharge instructions . Opportunity for questions and answers were provided . Armband removed by staff ,Pt discharged from ED. W/C  offered at D/C  and Declined W/C at D/C and was escorted to lobby by RN.  
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
## 2021-08-05 ENCOUNTER — Emergency Department (HOSPITAL_BASED_OUTPATIENT_CLINIC_OR_DEPARTMENT_OTHER): Payer: Medicaid Other

## 2021-08-05 ENCOUNTER — Other Ambulatory Visit: Payer: Self-pay

## 2021-08-05 ENCOUNTER — Encounter (HOSPITAL_BASED_OUTPATIENT_CLINIC_OR_DEPARTMENT_OTHER): Payer: Self-pay | Admitting: Emergency Medicine

## 2021-08-05 ENCOUNTER — Inpatient Hospital Stay (HOSPITAL_BASED_OUTPATIENT_CLINIC_OR_DEPARTMENT_OTHER)
Admission: AD | Admit: 2021-08-05 | Discharge: 2021-08-05 | Disposition: A | Discharge status: Home / Self Care | Payer: Medicaid Other | Attending: Family Medicine | Admitting: Family Medicine

## 2021-08-05 DIAGNOSIS — O26893 Other specified pregnancy related conditions, third trimester: Secondary | ICD-10-CM

## 2021-08-05 DIAGNOSIS — O0933 Supervision of pregnancy with insufficient antenatal care, third trimester: Secondary | ICD-10-CM | POA: Diagnosis not present

## 2021-08-05 DIAGNOSIS — O9A213 Injury, poisoning and certain other consequences of external causes complicating pregnancy, third trimester: Secondary | ICD-10-CM | POA: Diagnosis not present

## 2021-08-05 DIAGNOSIS — Z20822 Contact with and (suspected) exposure to covid-19: Secondary | ICD-10-CM | POA: Diagnosis not present

## 2021-08-05 DIAGNOSIS — Z3A29 29 weeks gestation of pregnancy: Secondary | ICD-10-CM | POA: Diagnosis not present

## 2021-08-05 LAB — COMPREHENSIVE METABOLIC PANEL
ALT: 17 U/L (ref 0–44)
AST: 27 U/L (ref 15–41)
Albumin: 3.1 g/dL — ABNORMAL LOW (ref 3.5–5.0)
Alkaline Phosphatase: 78 U/L (ref 38–126)
Anion gap: 8 (ref 5–15)
BUN: <5 mg/dL — ABNORMAL LOW (ref 6–20)
CO2: 21 mmol/L — ABNORMAL LOW (ref 22–32)
Calcium: 8.3 mg/dL — ABNORMAL LOW (ref 8.9–10.3)
Chloride: 106 mmol/L (ref 98–111)
Creatinine, Ser: 0.42 mg/dL — ABNORMAL LOW (ref 0.44–1.00)
GFR, Estimated: >60 mL/min (ref >60)
Glucose, Bld: 94 mg/dL (ref 70–99)
Potassium: 3.3 mmol/L — ABNORMAL LOW (ref 3.5–5.1)
Sodium: 135 mmol/L (ref 135–145)
Total Bilirubin: 0.3 mg/dL (ref 0.3–1.2)
Total Protein: 6.4 g/dL — ABNORMAL LOW (ref 6.5–8.1)

## 2021-08-05 LAB — LIPASE, BLOOD: Lipase: 27 U/L (ref 11–51)

## 2021-08-05 LAB — CBC WITH DIFFERENTIAL/PLATELET
Abs Immature Granulocytes: 0.11 10*3/uL — ABNORMAL HIGH (ref 0.00–0.07)
Basophils Absolute: 0 10*3/uL (ref 0.0–0.1)
Basophils Relative: 0 %
Eosinophils Absolute: 0 10*3/uL (ref 0.0–0.5)
Eosinophils Relative: 0 %
HCT: 33.6 % — ABNORMAL LOW (ref 36.0–46.0)
Hemoglobin: 11.4 g/dL — ABNORMAL LOW (ref 12.0–15.0)
Immature Granulocytes: 1 %
Lymphocytes Relative: 9 %
Lymphs Abs: 1.5 10*3/uL (ref 0.7–4.0)
MCH: 28.1 pg (ref 26.0–34.0)
MCHC: 33.9 g/dL (ref 30.0–36.0)
MCV: 82.8 fL (ref 80.0–100.0)
Monocytes Absolute: 0.7 10*3/uL (ref 0.1–1.0)
Monocytes Relative: 4 %
Neutro Abs: 13.8 10*3/uL — ABNORMAL HIGH (ref 1.7–7.7)
Neutrophils Relative %: 86 %
Platelets: 270 10*3/uL (ref 150–400)
RBC: 4.06 MIL/uL (ref 3.87–5.11)
RDW: 13.3 % (ref 11.5–15.5)
WBC: 16.1 10*3/uL — ABNORMAL HIGH (ref 4.0–10.5)
nRBC: 0 % (ref 0.0–0.2)

## 2021-08-05 LAB — CBC
HCT: 30.1 % — ABNORMAL LOW (ref 36.0–46.0)
Hemoglobin: 9.9 g/dL — ABNORMAL LOW (ref 12.0–15.0)
MCH: 28 pg (ref 26.0–34.0)
MCHC: 32.9 g/dL (ref 30.0–36.0)
MCV: 85 fL (ref 80.0–100.0)
Platelets: 243 10*3/uL (ref 150–400)
RBC: 3.54 MIL/uL — ABNORMAL LOW (ref 3.87–5.11)
RDW: 13.3 % (ref 11.5–15.5)
WBC: 11.7 10*3/uL — ABNORMAL HIGH (ref 4.0–10.5)
nRBC: 0 % (ref 0.0–0.2)

## 2021-08-05 LAB — RESP PANEL BY RT-PCR (FLU A&B, COVID) ARPGX2
Influenza A by PCR: NEGATIVE
Influenza B by PCR: NEGATIVE
SARS Coronavirus 2 by RT PCR: NEGATIVE

## 2021-08-05 LAB — HEPATITIS C ANTIBODY: HCV Ab: NONREACTIVE

## 2021-08-05 LAB — HEMOGLOBIN A1C
Hgb A1c MFr Bld: 4.8 % (ref 4.8–5.6)
Mean Plasma Glucose: 91.06 mg/dL

## 2021-08-05 LAB — HEPATITIS B SURFACE ANTIGEN: Hepatitis B Surface Ag: NONREACTIVE

## 2021-08-05 LAB — HIV ANTIBODY (ROUTINE TESTING W REFLEX): HIV Screen 4th Generation wRfx: NONREACTIVE
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
## 2021-08-05 MED ORDER — LIDOCAINE-EPINEPHRINE 2 %-1:100000 IJ SOLN: 20.0000 mL | Freq: Once | INTRAMUSCULAR | Status: DC

## 2021-08-05 MED ORDER — CYCLOBENZAPRINE HCL 7.5 MG PO TABS: 7.5000 mg | ORAL_TABLET | Freq: Three times a day (TID) | ORAL | 0 refills | Status: DC | PRN

## 2021-08-05 MED ORDER — ACETAMINOPHEN 500 MG PO TABS
1000.0000 mg | ORAL_TABLET | Freq: Once | ORAL | Status: AC
  Administered 2021-08-05: 1000 mg via ORAL
  Filled 2021-08-05: qty 2

## 2021-08-05 MED ORDER — SODIUM CHLORIDE 0.9 % IV SOLN: Freq: Once | INTRAVENOUS | Status: AC

## 2021-08-05 MED ORDER — CYCLOBENZAPRINE HCL 5 MG PO TABS
10.0000 mg | ORAL_TABLET | Freq: Once | ORAL | Status: AC
  Administered 2021-08-05: 10 mg via ORAL
  Filled 2021-08-05: qty 2

## 2021-08-05 MED ORDER — LIDOCAINE-EPINEPHRINE (PF) 1 %-1:200000 IJ SOLN
20.0000 mL | Freq: Once | INTRAMUSCULAR | Status: DC
  Filled 2021-08-05: qty 20

## 2021-08-05 MED ORDER — LIDOCAINE-EPINEPHRINE (PF) 2 %-1:200000 IJ SOLN
INTRAMUSCULAR | Status: AC
  Filled 2021-08-05: qty 20

## 2021-08-05 MED ORDER — LACTATED RINGERS IV BOLUS
1000.0000 mL | Freq: Once | INTRAVENOUS | Status: AC
  Administered 2021-08-05: 1000 mL via INTRAVENOUS
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
## 2021-08-05 NOTE — ED Notes (Addendum)
Spoke with Corrie Dandy at [phone] regarding patient. Updated that patient reports being [redacted] weeks pregnant, but based on LMP of 01/09/21, Pt is 29 weeks 5 days. Pt placed on monitor
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
## 2021-08-05 NOTE — Progress Notes (Signed)
FHR tracung is intermittent. Augusto Gamble RN says they will adjust the FM.

## 2021-08-05 NOTE — Progress Notes (Signed)
Dr. Shawnie Pons on unit. Pt is being transferred to [facility].
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
## 2021-08-05 NOTE — ED Notes (Signed)
Shannon Trujillo at [facility] endorses to leave pt. on monitor patient for 20-30 minutes for OB [place] to monitor and should be cleared by OB at that time

## 2021-08-05 NOTE — ED Notes (Signed)
Pt estimated LMP is end of April, which indicates pregnancy is 29 wks 5 days

## 2021-08-05 NOTE — ED Notes (Signed)
Lidocaine at bedside.
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
## 2021-08-05 NOTE — Progress Notes (Signed)
FHR tracing is intermittent x 11 min. [facility] RN asked to adjust the fetal monitor. Says she will adjust the monitor.
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
## 2021-08-05 NOTE — ED Notes (Signed)
Spoke with Corrie Dandy at [facility], she called to verify transfer to MAU. Informed that pt is waiting for transport, getting lac repair

## 2021-08-06 ENCOUNTER — Other Ambulatory Visit: Payer: Self-pay | Admitting: Student

## 2021-08-06 DIAGNOSIS — Z3A29 29 weeks gestation of pregnancy: Secondary | ICD-10-CM

## 2021-08-06 DIAGNOSIS — O0933 Supervision of pregnancy with insufficient antenatal care, third trimester: Secondary | ICD-10-CM

## 2021-08-06 LAB — CULTURE, OB URINE
Culture: NO GROWTH
Special Requests: NORMAL

## 2021-08-06 LAB — RPR: RPR Ser Ql: NONREACTIVE

## 2021-08-07 LAB — RUBELLA SCREEN: Rubella: 3.46 index (ref >0.99)

## 2021-09-06 ENCOUNTER — Other Ambulatory Visit: Payer: Self-pay

## 2021-09-06 ENCOUNTER — Ambulatory Visit (INDEPENDENT_AMBULATORY_CARE_PROVIDER_SITE_OTHER): Payer: Medicaid Other | Admitting: Family Medicine

## 2021-09-06 ENCOUNTER — Encounter: Payer: Self-pay | Admitting: Family Medicine

## 2021-09-06 VITALS — BP 130/92 | HR 76 | Wt 133.7 lb

## 2021-09-06 DIAGNOSIS — L299 Pruritus, unspecified: Secondary | ICD-10-CM

## 2021-09-06 DIAGNOSIS — O0990 Supervision of high risk pregnancy, unspecified, unspecified trimester: Secondary | ICD-10-CM | POA: Diagnosis not present

## 2021-09-06 DIAGNOSIS — Z8759 Personal history of other complications of pregnancy, childbirth and the puerperium: Secondary | ICD-10-CM | POA: Diagnosis not present

## 2021-09-06 MED ORDER — PRENATAL 27-1 MG PO TABS
1.0000 | ORAL_TABLET | Freq: Every day | ORAL | 8 refills | Status: AC
Start: 2021-09-06 — End: ?

## 2021-09-06 MED ORDER — BLOOD PRESSURE KIT DEVI: 1.0000 | Freq: Every day | 0 refills | Status: DC

## 2021-09-06 NOTE — Progress Notes (Signed)
Subjective:   Shannon Trujillo is a 25 y.o. Shannon Trujillo at [redacted]w[redacted]d by LMP being seen today for her first obstetrical visit.  Her obstetrical history is significant for  late prenatal care, hx of PPH . Patient does intend to breast feed. Pregnancy history fully reviewed.  Patient reports  itching in her palms and legs for the past two days .  HISTORY: OB History  Gravida Para Term Preterm AB Living  3 2 2  0 0 2  SAB IAB Ectopic Multiple Live Births  0 0 0 0 2    # Outcome Date GA Lbr Len/2nd Weight Sex Delivery Anes PTL Lv  3 Current           2 Term 03/24/18 [redacted]w[redacted]d 03:56 / 00:12 7 lb 8.3 oz (3.41 kg) F Vag-Spont None  LIV     Name: [name],NOVA Trujillo     Apgar1: 9  Apgar5: 9  1 Term 12/03/15 [redacted]w[redacted]d 05:19 / 00:20 6 lb 8 oz (2.948 kg) F Vag-Spont None  LIV     Birth Comments: baby was adopted     Name: Shannon Trujillo GIRL     Apgar1: 8  Apgar5: 9     Last pap smear: No results found for: DIAGPAP, HPV, HPVHIGH Needs  Past Medical History:  Diagnosis Date   Anemia in pregnancy 02/18/2018   Postpartum hemorrhage 12/03/2015   Past Surgical History:  Procedure Laterality Date   NO PAST SURGERIES     Family History  Problem Relation Age of Onset   Diabetes Paternal Grandmother    Anemia Mother    Social History   Tobacco Use   Smoking status: Former    Types: E-cigarettes   Smokeless tobacco: Former  12/05/2015 Use: Every day   Substances: Nicotine, Flavoring  Substance Use Topics   Alcohol use: No   Drug use: No   No Known Allergies Current Outpatient Medications on File Prior to Visit  Medication Sig Dispense Refill   cyclobenzaprine (FEXMID) 7.5 MG tablet Take 1 tablet (7.5 mg total) by mouth 3 (three) times daily as needed (hip pain). (Patient not taking: Reported on 09/06/2021) 20 tablet 0   No current facility-administered medications on file prior to visit.     Exam   Vitals:   09/06/21 1014  BP: (!) 130/92  Pulse: 76  Weight: 133 lb 11.2 oz  (60.6 kg)   Fetal Heart Rate (bpm): 136  System: General: well-developed, well-nourished female in no acute distress   Skin: normal coloration and turgor, no rashes   Neurologic: oriented, normal, negative, normal mood   Extremities: normal strength, tone, and muscle mass, ROM of all joints is normal   HEENT PERRLA, extraocular movement intact and sclera clear, anicteric   Neck supple and no masses   Respiratory:  no respiratory distress      Assessment:   Pregnancy: 09/08/21 Patient Active Problem List   Diagnosis Date Noted   Supervision of high risk pregnancy, antepartum 09/06/2021   History of pregnancy induced hypertension 09/06/2021   IUD (intrauterine device) in place 08/21/2018     Plan:  1. Supervision of high risk pregnancy, antepartum Initial labs drawn. Continue prenatal vitamins. Genetic Screening discussed, NIPS: ordered. Ultrasound discussed; fetal anatomic survey: ordered. Problem list reviewed and updated. The nature of [facility] with multiple MDs and other Advanced Practice Providers was explained to patient; also emphasized that residents, students are part of our team.  2. History of pregnancy induced hypertension Mild range BP today Check preE labs Reports hx of high blood pressure but no others found on chart review D/c summary prior prior pregnancies reviewed, no mention of PIH  3. Pruritus Reports two days of itching on legs and palms of her hands Check bile acids, CMP Strongly emphasized importance of monitoring fetal movement, and need to go to MAU for any DFM  Routine obstetric precautions reviewed. Return in 2 weeks (on 09/20/2021) for [facility], ob visit.

## 2021-09-06 NOTE — Patient Instructions (Signed)
Third Trimester of Pregnancy The third trimester of pregnancy is from week 28 through week 32. This is months 7 through 9. The third trimester is a time when the unborn baby (fetus) is growing rapidly. At the end of the ninth month, the fetus is about 20 inches long and weighs 6-10 pounds. Body changes during your third trimester During the third trimester, your body will continue to go through many changes. The changes vary and generally return to normal after your baby is born. Physical changes Your weight will continue to increase. You can expect to gain 25-35 pounds (11-16 kg) by the end of the pregnancy if you begin pregnancy at a normal weight. If you are underweight, you can expect to gain 28-40 lb (about 13-18 kg), and if you are overweight, you can expect to gain 15-25 lb (about 7-11 kg). You may begin to get stretch marks on your hips, abdomen, and breasts. Your breasts will continue to grow and may hurt. A yellow fluid (colostrum) may leak from your breasts. This is the first milk you are producing for your baby. You may have changes in your hair. These can include thickening of your hair, rapid growth, and changes in texture. Some people also have hair loss during or after pregnancy, or hair that feels dry or thin. Your belly button may stick out. You may notice more swelling in your hands, face, or ankles. Health changes You may have heartburn. You may have constipation. You may develop hemorrhoids. You may develop swollen, bulging veins (varicose veins) in your legs. You may have increased body aches in the pelvis, back, or thighs. This is due to weight gain and increased hormones that are relaxing your joints. You may have increased tingling or numbness in your hands, arms, and legs. The skin on your abdomen may also feel numb. You may feel short of breath because of your expanding uterus. Other changes You may urinate more often because the fetus is moving lower into your pelvis  and pressing on your bladder. You may have more problems sleeping. This may be caused by the size of your abdomen, an increased need to urinate, and an increase in your body's metabolism. You may notice the fetus "dropping," or moving lower in your abdomen (lightening). You may have increased vaginal discharge. You may notice that you have pain around your pelvic bone as your uterus distends. Follow these instructions at home: Medicines Follow your health care provider's instructions regarding medicine use. Specific medicines may be either safe or unsafe to take during pregnancy. Do not take any medicines unless approved by your health care provider. Take a prenatal vitamin that contains at least 600 micrograms (mcg) of folic acid. Eating and drinking Eat a healthy diet that includes fresh fruits and vegetables, whole grains, good sources of protein such as meat, eggs, or tofu, and low-fat dairy products. Avoid raw meat and unpasteurized juice, milk, and cheese. These carry germs that can harm you and your baby. Eat 4 or 5 small meals rather than 3 large meals a day. You may need to take these actions to prevent or treat constipation: Drink enough fluid to keep your urine pale yellow. Eat foods that are high in fiber, such as beans, whole grains, and fresh fruits and vegetables. Limit foods that are high in fat and processed sugars, such as fried or sweet foods. Activity Exercise only as directed by your health care provider. Most people can continue their usual exercise routine during pregnancy. Try to  exercise for 30 minutes at least 5 days a week. Stop exercising if you experience contractions in the uterus. °Stop exercising if you develop pain or cramping in the lower abdomen or lower back. °Avoid heavy lifting. °Do not exercise if it is very hot or humid or if you are at a high altitude. °If you choose to, you may continue to have sex unless your health care provider tells you not  to. °Relieving pain and discomfort °Take frequent breaks and rest with your legs raised (elevated) if you have leg cramps or low back pain. °Take warm sitz baths to soothe any pain or discomfort caused by hemorrhoids. Use hemorrhoid cream if your health care provider approves. °Wear a supportive bra to prevent discomfort from breast tenderness. °If you develop varicose veins: °Wear support hose as told by your health care provider. °Elevate your feet for 15 minutes, 3-4 times a day. °Limit salt in your diet. °Safety °Talk to your health care provider before traveling far distances. °Do not use hot tubs, steam rooms, or saunas. °Wear your seat belt at all times when driving or riding in a car. °Talk with your health care provider if someone is verbally or physically abusive to you. °Preparing for birth °To prepare for the arrival of your baby: °Take prenatal classes to understand, practice, and ask questions about labor and delivery. °Visit the hospital and tour the maternity area. °Purchase a rear-facing car seat and make sure you know how to install it in your car. °Prepare the baby's room or sleeping area. Make sure to remove all pillows and stuffed animals from the baby's crib to prevent suffocation. °General instructions °Avoid cat litter boxes and soil used by cats. These carry germs that can cause birth defects in the baby. If you have a cat, ask someone to clean the litter box for you. °Do not douche or use tampons. Do not use scented sanitary pads. °Do not use any products that contain nicotine or tobacco, such as cigarettes, e-cigarettes, and chewing tobacco. If you need help quitting, ask your health care provider. °Do not use any herbal remedies, illegal drugs, or medicines that were not prescribed to you. Chemicals in these products can harm your baby. °Do not drink alcohol. °You will have more frequent prenatal exams during the third trimester. During a routine prenatal visit, your health care provider  will do a physical exam, perform tests, and discuss your overall health. Keep all follow-up visits. This is important. °Where to find more information °American Pregnancy Association: americanpregnancy.org °American College of Obstetricians and Gynecologists: acog.org/en/Womens%20Health/Pregnancy °Office on Women's Health: womenshealth.gov/pregnancy °Contact a health care provider if you have: °A fever. °Mild pelvic cramps, pelvic pressure, or nagging pain in your abdominal area or lower back. °Vomiting or diarrhea. °Bad-smelling vaginal discharge or foul-smelling urine. °Pain when you urinate. °A headache that does not go away when you take medicine. °Visual changes or see spots in front of your eyes. °Get help right away if: °Your water breaks. °You have regular contractions less than 5 minutes apart. °You have spotting or bleeding from your vagina. °You have severe abdominal pain. °You have difficulty breathing. °You have chest pain. °You have fainting spells. °You have not felt your baby move for the time period told by your health care provider. °You have new or increased pain, swelling, or redness in an arm or leg. °Summary °The third trimester of pregnancy is from week 28 through week 40 (months 7 through 9). °You may have more problems sleeping.   This can be caused by the size of your abdomen, an increased need to urinate, and an increase in your body's metabolism. You will have more frequent prenatal exams during the third trimester. Keep all follow-up visits. This is important. This information is not intended to replace advice given to you by your health care provider. Make sure you discuss any questions you have with your health care provider. Document Revised: 02/10/2020 Document Reviewed: 12/17/2019 Elsevier Patient Education  2022 [employer].  Contraception Choices Contraception, also called birth control, refers to methods or devices that prevent pregnancy. Hormonal methods Contraceptive  implant A contraceptive implant is a thin, plastic tube that contains a hormone that prevents pregnancy. It is different from an intrauterine device (IUD). It is inserted into the upper part of the arm by a health care provider. Implants can be effective for up to 3 years. Progestin-only injections Progestin-only injections are injections of progestin, a synthetic form of the hormone progesterone. They are given every 3 months by a health care provider. Birth control pills Birth control pills are pills that contain hormones that prevent pregnancy. They must be taken once a day, preferably at the same time each day. A prescription is needed to use this method of contraception. Birth control patch The birth control patch contains hormones that prevent pregnancy. It is placed on the skin and must be changed once a week for three weeks and removed on the fourth week. A prescription is needed to use this method of contraception. Vaginal ring A vaginal ring contains hormones that prevent pregnancy. It is placed in the vagina for three weeks and removed on the fourth week. After that, the process is repeated with a new ring. A prescription is needed to use this method of contraception. Emergency contraceptive Emergency contraceptives prevent pregnancy after unprotected sex. They come in pill form and can be taken up to 5 days after sex. They work best the sooner they are taken after having sex. Most emergency contraceptives are available without a prescription. This method should not be used as your only form of birth control. Barrier methods Female condom A female condom is a thin sheath that is worn over the penis during sex. Condoms keep sperm from going inside a woman's body. They can be used with a sperm-killing substance (spermicide) to increase their effectiveness. They should be thrown away after one use. Female condom A female condom is a soft, loose-fitting sheath that is put into the vagina before  sex. The condom keeps sperm from going inside a woman's body. They should be thrown away after one use. Diaphragm A diaphragm is a soft, dome-shaped barrier. It is inserted into the vagina before sex, along with a spermicide. The diaphragm blocks sperm from entering the uterus, and the spermicide kills sperm. A diaphragm should be left in the vagina for 6-8 hours after sex and removed within 24 hours. A diaphragm is prescribed and fitted by a health care provider. A diaphragm should be replaced every 1-2 years, after giving birth, after gaining more than 15 lb (6.8 kg), and after pelvic surgery. Cervical cap A cervical cap is a round, soft latex or plastic cup that fits over the cervix. It is inserted into the vagina before sex, along with spermicide. It blocks sperm from entering the uterus. The cap should be left in place for 6-8 hours after sex and removed within 48 hours. A cervical cap must be prescribed and fitted by a health care provider. It should be replaced  every 2 years. Sponge A sponge is a soft, circular piece of polyurethane foam with spermicide in it. The sponge helps block sperm from entering the uterus, and the spermicide kills sperm. To use it, you make it wet and then insert it into the vagina. It should be inserted before sex, left in for at least 6 hours after sex, and removed and thrown away within 30 hours. Spermicides Spermicides are chemicals that kill or block sperm from entering the cervix and uterus. They can come as a cream, jelly, suppository, foam, or tablet. A spermicide should be inserted into the vagina with an applicator at least 10-15 minutes before sex to allow time for it to work. The process must be repeated every time you have sex. Spermicides do not require a prescription. Intrauterine contraception Intrauterine device (IUD) An IUD is a T-shaped device that is put in a woman's uterus. There are two types: Hormone IUD.This type contains progestin, a synthetic  form of the hormone progesterone. This type can stay in place for 3-5 years. Copper IUD.This type is wrapped in copper wire. It can stay in place for 10 years. Permanent methods of contraception Female tubal ligation In this method, a woman's fallopian tubes are sealed, tied, or blocked during surgery to prevent eggs from traveling to the uterus. Hysteroscopic sterilization In this method, a small, flexible insert is placed into each fallopian tube. The inserts cause scar tissue to form in the fallopian tubes and block them, so sperm cannot reach an egg. The procedure takes about 3 months to be effective. Another form of birth control must be used during those 3 months. Female sterilization This is a procedure to tie off the tubes that carry sperm (vasectomy). After the procedure, the man can still ejaculate fluid (semen). Another form of birth control must be used for 3 months after the procedure. Natural planning methods Natural family planning In this method, a couple does not have sex on days when the woman could become pregnant. Calendar method In this method, the woman keeps track of the length of each menstrual cycle, identifies the days when pregnancy can happen, and does not have sex on those days. Ovulation method In this method, a couple avoids sex during ovulation. Symptothermal method This method involves not having sex during ovulation. The woman typically checks for ovulation by watching changes in her temperature and in the consistency of cervical mucus. Post-ovulation method In this method, a couple waits to have sex until after ovulation. Where to find more information Centers for Disease Control and Prevention: [employer].br Summary Contraception, also called birth control, refers to methods or devices that prevent pregnancy. Hormonal methods of contraception include implants, injections, pills, patches, vaginal rings, and emergency contraceptives. Barrier methods of  contraception can include female condoms, female condoms, diaphragms, cervical caps, sponges, and spermicides. There are two types of IUDs (intrauterine devices). An IUD can be put in a woman's uterus to prevent pregnancy for 3-5 years. Permanent sterilization can be done through a procedure for males and females. Natural family planning methods involve nothaving sex on days when the woman could become pregnant. This information is not intended to replace advice given to you by your health care provider. Make sure you discuss any questions you have with your health care provider. Document Revised: 02/08/2020 Document Reviewed: 02/08/2020 Elsevier Patient Education  2022 [employer].

## 2021-09-06 NOTE — Assessment & Plan Note (Deleted)
°  Nursing Staff Provider  Office Location  [facility] Dating    [facility] Model [ ]  Traditional [ ]  Centering [ ]  Mom-Baby Dyad    Language  English Anatomy    Flu Vaccine  Declined Genetic/Carrier Screen  NIPS:    AFP:    Horizon:  TDaP Vaccine    Hgb A1C or  GTT Early  Third trimester   COVID Vaccine    LAB RESULTS   Rhogam   Blood Type     Baby Feeding Plan Breast and bottle Antibody    Contraception Undecided Rubella 3.46 (11/19 1828)  Circumcision Yes RPR NON REACTIVE (11/19 1828)   Pediatrician  [facility] [place] HBsAg NON REACTIVE (11/19 1828)   Support Person FOB HCVAb   Prenatal Classes  HIV Non Reactive (11/19 1828)     BTL Consent  GBS   (For PCN allergy, check sensitivities)   VBAC Consent  Pap        DME Rx [ X] BP cuff [ ]  Weight Scale Waterbirth  [ ]  Class [ ]  Consent [ ]  CNM visit  PHQ9 & GAD7 [ X ] new OB [  ] 28 weeks  [  ] 36 weeks Induction  [ ]  Orders Entered [ ] Foley Y/N

## 2021-09-07 ENCOUNTER — Telehealth: Payer: Self-pay | Admitting: Family Medicine

## 2021-09-07 DIAGNOSIS — O26613 Liver and biliary tract disorders in pregnancy, third trimester: Secondary | ICD-10-CM | POA: Insufficient documentation

## 2021-09-07 DIAGNOSIS — K831 Obstruction of bile duct: Secondary | ICD-10-CM

## 2021-09-07 DIAGNOSIS — O26643 Intrahepatic cholestasis of pregnancy, third trimester: Secondary | ICD-10-CM

## 2021-09-07 LAB — CBC
Hematocrit: 31.5 % — ABNORMAL LOW (ref 34.0–46.6)
Hemoglobin: 10.5 g/dL — ABNORMAL LOW (ref 11.1–15.9)
MCH: 27.1 pg (ref 26.6–33.0)
MCHC: 33.3 g/dL (ref 31.5–35.7)
MCV: 81 fL (ref 79–97)
Platelets: 186 10*3/uL (ref 150–450)
RBC: 3.88 x10E6/uL (ref 3.77–5.28)
RDW: 12.9 % (ref 11.7–15.4)
WBC: 6.9 10*3/uL (ref 3.4–10.8)

## 2021-09-07 LAB — COMPREHENSIVE METABOLIC PANEL
ALT: 113 IU/L — ABNORMAL HIGH (ref 0–32)
AST: 82 IU/L — ABNORMAL HIGH (ref 0–40)
Albumin/Globulin Ratio: 1.2 (ref 1.2–2.2)
Albumin: 2.9 g/dL — ABNORMAL LOW (ref 3.9–5.0)
Alkaline Phosphatase: 164 IU/L — ABNORMAL HIGH (ref 44–121)
BUN/Creatinine Ratio: 6 — ABNORMAL LOW (ref 9–23)
BUN: 4 mg/dL — ABNORMAL LOW (ref 6–20)
Bilirubin Total: 0.8 mg/dL (ref 0.0–1.2)
CO2: 19 mmol/L — ABNORMAL LOW (ref 20–29)
Calcium: 8.4 mg/dL — ABNORMAL LOW (ref 8.7–10.2)
Chloride: 104 mmol/L (ref 96–106)
Creatinine, Ser: 0.68 mg/dL (ref 0.57–1.00)
Globulin, Total: 2.5 g/dL (ref 1.5–4.5)
Glucose: 85 mg/dL (ref 70–99)
Potassium: 3.4 mmol/L — ABNORMAL LOW (ref 3.5–5.2)
Sodium: 139 mmol/L (ref 134–144)
Total Protein: 5.4 g/dL — ABNORMAL LOW (ref 6.0–8.5)
eGFR: 125 mL/min/{1.73_m2} (ref >59)

## 2021-09-07 LAB — PROTEIN / CREATININE RATIO, URINE
Creatinine, Urine: 242.2 mg/dL
Protein, Ur: 72.2 mg/dL
Protein/Creat Ratio: 298 mg/g creat — ABNORMAL HIGH (ref 0–200)

## 2021-09-07 LAB — BILE ACIDS, TOTAL: Bile Acids Total: 110.7 umol/L (ref 0.0–10.0)

## 2021-09-07 LAB — ABO AND RH: Rh Factor: POSITIVE

## 2021-09-07 LAB — ANTIBODY SCREEN: Antibody Screen: NEGATIVE

## 2021-09-07 MED ORDER — URSODIOL 300 MG PO CAPS: 300.0000 mg | ORAL_CAPSULE | Freq: Two times a day (BID) | ORAL | 1 refills | Status: DC

## 2021-09-07 NOTE — Telephone Encounter (Signed)
Attempted to call patient to discuss results of labs from her recent visit.   Bile acids returned markedly elevated at 110, her LFT's are also abnormal with AST/ALT of 82/113, conssitent with cholestasis of pregnancy.   Have sent Actigall to her pharmacy. Unfortunately I was unable to reach the patient.  Discussed with Dr. Grace Bushy, recommends delivery at 36 weeks. Currently she is scheduled for first anatomy scan after this gestational age, message sent to MFM clinic to hopefully move up her anatomy scan.  Clinical pool please try to contact patient again in AM to go over new diagnosis, recommendation for Actigall, and FYI that MFM will hopefully be calling her to move up her anatomy scan.  If possible please also schedule her for a BPP for next available.

## 2021-09-08 ENCOUNTER — Other Ambulatory Visit: Payer: Self-pay

## 2021-09-08 ENCOUNTER — Encounter: Payer: Self-pay | Admitting: General Practice

## 2021-09-08 ENCOUNTER — Ambulatory Visit: Payer: Medicaid Other | Admitting: *Deleted

## 2021-09-08 ENCOUNTER — Other Ambulatory Visit: Payer: Self-pay | Admitting: *Deleted

## 2021-09-08 VITALS — BP 129/81 | HR 60

## 2021-09-08 DIAGNOSIS — K831 Obstruction of bile duct: Secondary | ICD-10-CM

## 2021-09-08 DIAGNOSIS — O26843 Uterine size-date discrepancy, third trimester: Secondary | ICD-10-CM

## 2021-09-08 MED ORDER — URSODIOL 500 MG PO TABS: 500.0000 mg | ORAL_TABLET | Freq: Two times a day (BID) | ORAL | 1 refills | Status: DC

## 2021-09-08 MED ORDER — HYDROXYZINE HCL 10 MG PO TABS: 10.0000 mg | ORAL_TABLET | Freq: Three times a day (TID) | ORAL | 0 refills | Status: DC | PRN

## 2021-09-08 NOTE — Progress Notes (Signed)
Pt informed that the ultrasound is considered a limited OB ultrasound and is not intended to be a complete ultrasound exam.  Patient also informed that the ultrasound is not being completed with the intent of assessing for fetal or placental anomalies or any pelvic abnormalities.  Explained that the purpose of todays ultrasound is to assess for presentation, BPP and amniotic fluid volume.  Patient acknowledges the purpose of the exam and the limitations of the study.  Pt was informed of plan of care including weekly fetal testing and Rx for Ursodiol and Hydroxyzine.  Pt advised to go to [facility] is she experiences decreased FM. She voiced understanding of all information and instructions given.

## 2021-09-08 NOTE — Telephone Encounter (Signed)
Spoke with pt and informed her of recent test results consistent with Cholestasis as well as recommendation for fetal testing (NST/BPP) today. She voiced understanding and agreed to come to office within the hour.

## 2021-09-09 ENCOUNTER — Ambulatory Visit (INDEPENDENT_AMBULATORY_CARE_PROVIDER_SITE_OTHER): Payer: Medicaid Other

## 2021-09-09 DIAGNOSIS — O26613 Liver and biliary tract disorders in pregnancy, third trimester: Secondary | ICD-10-CM | POA: Diagnosis not present

## 2021-09-09 DIAGNOSIS — K831 Obstruction of bile duct: Secondary | ICD-10-CM | POA: Diagnosis not present

## 2021-09-10 NOTE — Progress Notes (Signed)
° °  PRENATAL VISIT NOTE  Subjective:  Shannon Trujillo is a 25 y.o. G3P2002 at [redacted]w[redacted]d being seen today for ongoing prenatal care.  She is currently monitored for the following issues for this high-risk pregnancy and has Supervision of high risk pregnancy, antepartum; History of pregnancy induced hypertension; and Cholestasis during pregnancy in third trimester on their problem list.  Patient reports no complaints except itching continues despite ursodiol.  Contractions: Not present. Vag. Bleeding: None.  Movement: Present. Denies leaking of fluid.   The following portions of the patient's history were reviewed and updated as appropriate: allergies, current medications, past family history, past medical history, past social history, past surgical history and problem list.   Objective:   Vitals:   09/12/21 1400  BP: 127/81  Pulse: 73  Weight: 136 lb (61.7 kg)    Fetal Status: Fetal Heart Rate (bpm): 146 Fundal Height: 35 cm Movement: Present  Presentation: Vertex  General:  Alert, oriented and cooperative. Patient is in no acute distress.  Skin: Skin is warm and dry. No rash noted.   Cardiovascular: Normal heart rate noted  Respiratory: Normal respiratory effort, no problems with respiration noted  Abdomen: Soft, gravid, appropriate for gestational age.  Pain/Pressure: Absent     Pelvic: Cervical exam performed in the presence of a chaperone Dilation: 3 Effacement (%): 60 Station: -1  Extremities: Normal range of motion.     Mental Status: Normal mood and affect. Normal behavior. Normal judgment and thought content.   Assessment and Plan:  Pregnancy: G3P2002 at [redacted]w[redacted]d 1. Supervision of high risk pregnancy, antepartum Late, limited PNC presenting at 34w - no GTT done yet. She declines 1 hr GBS/GC/CT done today  2. History of pregnancy induced hypertension 130/92 at last appt, today it is 127/81 P/C ratio was 298 at last appt  3. Cholestasis during pregnancy in third trimester BPP  done on 12/23 but pending final report Next [place] scheduled for 12/30 She is on Ursodiol for bile acids that were 110. LFTs elevated last time Timing of delivery based on very high bile acids at 36w. Scheduled for 1/3. Reviewed process with pt and support person.   Preterm labor symptoms and general obstetric precautions including but not limited to vaginal bleeding, contractions, leaking of fluid and fetal movement were reviewed in detail with the patient. Please refer to After Visit Summary for other counseling recommendations.   Return in about 1 week (around 09/19/2021) for OB VISIT, MD or APP, Needs BPP/NST weekly.  Future Appointments  Date Time Provider Department Center  09/15/2021  2:15 PM [facility] NURSE [facility]  09/15/2021  2:30 PM [facility] US3 [facility]  09/22/2021 10:15 AM Hermina Staggers, MD [facility]    Milas Hock, MD

## 2021-09-12 ENCOUNTER — Ambulatory Visit (INDEPENDENT_AMBULATORY_CARE_PROVIDER_SITE_OTHER): Payer: Medicaid Other | Admitting: Obstetrics and Gynecology

## 2021-09-12 ENCOUNTER — Other Ambulatory Visit (HOSPITAL_COMMUNITY)
Admission: RE | Admit: 2021-09-12 | Discharge: 2021-09-12 | Disposition: A | Discharge status: Home / Self Care | Payer: Medicaid Other | Source: Ambulatory Visit | Attending: Obstetrics and Gynecology | Admitting: Obstetrics and Gynecology

## 2021-09-12 ENCOUNTER — Other Ambulatory Visit: Payer: Self-pay

## 2021-09-12 VITALS — BP 127/81 | HR 73 | Wt 136.0 lb

## 2021-09-12 DIAGNOSIS — K831 Obstruction of bile duct: Secondary | ICD-10-CM

## 2021-09-12 DIAGNOSIS — O26613 Liver and biliary tract disorders in pregnancy, third trimester: Secondary | ICD-10-CM | POA: Diagnosis not present

## 2021-09-12 DIAGNOSIS — O0990 Supervision of high risk pregnancy, unspecified, unspecified trimester: Secondary | ICD-10-CM | POA: Diagnosis present

## 2021-09-12 DIAGNOSIS — Z8759 Personal history of other complications of pregnancy, childbirth and the puerperium: Secondary | ICD-10-CM | POA: Diagnosis not present

## 2021-09-12 LAB — OB RESULTS CONSOLE GC/CHLAMYDIA: Gonorrhea: NEGATIVE

## 2021-09-12 NOTE — Addendum Note (Signed)
Addended by: Faythe Casa on: 09/12/2021 04:04 PM   Modules accepted: Orders

## 2021-09-12 NOTE — Addendum Note (Signed)
Addended by: Guy Begin on: 09/12/2021 04:13 PM   Modules accepted: Orders

## 2021-09-13 ENCOUNTER — Other Ambulatory Visit: Payer: Self-pay | Admitting: Obstetrics and Gynecology

## 2021-09-13 ENCOUNTER — Telehealth (HOSPITAL_COMMUNITY): Payer: Self-pay | Admitting: *Deleted

## 2021-09-13 LAB — COMPREHENSIVE METABOLIC PANEL
ALT: 70 IU/L — ABNORMAL HIGH (ref 0–32)
AST: 51 IU/L — ABNORMAL HIGH (ref 0–40)
Albumin/Globulin Ratio: 1.4 (ref 1.2–2.2)
Albumin: 3 g/dL — ABNORMAL LOW (ref 3.9–5.0)
Alkaline Phosphatase: 208 IU/L — ABNORMAL HIGH (ref 44–121)
BUN/Creatinine Ratio: 7 — ABNORMAL LOW (ref 9–23)
BUN: 5 mg/dL — ABNORMAL LOW (ref 6–20)
Bilirubin Total: 0.4 mg/dL (ref 0.0–1.2)
CO2: 18 mmol/L — ABNORMAL LOW (ref 20–29)
Calcium: 8.4 mg/dL — ABNORMAL LOW (ref 8.7–10.2)
Chloride: 106 mmol/L (ref 96–106)
Creatinine, Ser: 0.69 mg/dL (ref 0.57–1.00)
Globulin, Total: 2.1 g/dL (ref 1.5–4.5)
Glucose: 93 mg/dL (ref 70–99)
Potassium: 3.9 mmol/L (ref 3.5–5.2)
Sodium: 137 mmol/L (ref 134–144)
Total Protein: 5.1 g/dL — ABNORMAL LOW (ref 6.0–8.5)
eGFR: 125 mL/min/{1.73_m2} (ref >59)

## 2021-09-13 LAB — CERVICOVAGINAL ANCILLARY ONLY
Chlamydia: NEGATIVE
Comment: NEGATIVE
Comment: NORMAL
Neisseria Gonorrhea: NEGATIVE

## 2021-09-13 NOTE — Telephone Encounter (Signed)
Preadmission screen  

## 2021-09-14 ENCOUNTER — Telehealth (HOSPITAL_COMMUNITY): Payer: Self-pay | Admitting: *Deleted

## 2021-09-14 NOTE — Telephone Encounter (Signed)
Preadmission screen  

## 2021-09-15 ENCOUNTER — Ambulatory Visit: Payer: Medicaid Other | Admitting: *Deleted

## 2021-09-15 ENCOUNTER — Ambulatory Visit: Payer: Medicaid Other | Attending: Student

## 2021-09-15 ENCOUNTER — Ambulatory Visit (HOSPITAL_BASED_OUTPATIENT_CLINIC_OR_DEPARTMENT_OTHER): Payer: Medicaid Other | Admitting: Obstetrics and Gynecology

## 2021-09-15 ENCOUNTER — Other Ambulatory Visit: Payer: Self-pay | Admitting: Family Medicine

## 2021-09-15 ENCOUNTER — Other Ambulatory Visit: Payer: Self-pay

## 2021-09-15 VITALS — BP 120/88 | HR 96

## 2021-09-15 DIAGNOSIS — K831 Obstruction of bile duct: Secondary | ICD-10-CM

## 2021-09-15 DIAGNOSIS — O26613 Liver and biliary tract disorders in pregnancy, third trimester: Secondary | ICD-10-CM

## 2021-09-15 DIAGNOSIS — Z3A29 29 weeks gestation of pregnancy: Secondary | ICD-10-CM | POA: Diagnosis not present

## 2021-09-15 DIAGNOSIS — O0990 Supervision of high risk pregnancy, unspecified, unspecified trimester: Secondary | ICD-10-CM

## 2021-09-15 DIAGNOSIS — O26843 Uterine size-date discrepancy, third trimester: Secondary | ICD-10-CM | POA: Diagnosis not present

## 2021-09-15 DIAGNOSIS — Z8759 Personal history of other complications of pregnancy, childbirth and the puerperium: Secondary | ICD-10-CM | POA: Diagnosis present

## 2021-09-15 DIAGNOSIS — Z3A35 35 weeks gestation of pregnancy: Secondary | ICD-10-CM | POA: Insufficient documentation

## 2021-09-15 DIAGNOSIS — O0933 Supervision of pregnancy with insufficient antenatal care, third trimester: Secondary | ICD-10-CM

## 2021-09-15 DIAGNOSIS — O26643 Intrahepatic cholestasis of pregnancy, third trimester: Secondary | ICD-10-CM

## 2021-09-15 NOTE — Procedures (Signed)
Shannon Trujillo 1996-06-18 [redacted]w[redacted]d  Fetus A Non-Stress Test Interpretation for 09/15/21  Indication:  late prenatal care  Fetal Heart Rate A Mode: External Baseline Rate (A): 115 bpm Variability: Moderate Accelerations: 15 x 15 Decelerations: None  Uterine Activity Mode: Toco Contraction Frequency (min): UI Resting Tone Palpated: Relaxed  Interpretation (Fetal Testing) Nonstress Test Interpretation: Reactive Overall Impression: Reassuring for gestational age Comments: tracing reviewed by Dr. Judeth Cornfield

## 2021-09-15 NOTE — Progress Notes (Signed)
Maternal-Fetal Medicine   Name: Shannon Trujillo DOB: [birth year] MRN: [id number] Referring Provider: Merian Capron, MD  I had the pleasure of seeing [name] today at [facility]. She is G3 P2002 at 35w 4d gestation and is here for ultrasound.  She has a recent diagnosis of cholestasis in pregnancy and the patient takes Actigall 500 mg twice daily.  Total bile acids performed 9 days ago was 110 micromol/L. Obstetrical history significant for 2 term vaginal deliveries.  Ultrasound Fetal growth is appropriate for gestational age.  Amniotic fluid is normal good fetal activity seen.  Fetal anatomical survey appears normal but limited by advanced gestational age.  Fetal breathing movements did not meet the criteria of BPP.  NST reactive.  BPP 8/10.  I reassured the patient of the findings. Intrahepatic cholestasis of pregnancy -Incidence is between 0.3 to 0.5%. -Stillbirth is the most significant adverse outcome that occurs in about 1.2% of these pregnancies (compared with 1-2 out of 1,000 normal pregnancies).  Proposed to pathophysiological mechanisms or fetal arrhythmia or vasospasm of fetal vessels because of increased bile acid levels. -Ursodeoxycholic acid is safe in pregnancy.  About 25% of women experience nausea and vomiting.  Complete relief from pruritus is not seen in some women. -Discussed timing of delivery.  If, however bile acid levels are greater than 100 mol/L, risk of stillbirth is significantly increased. -I reassured her of today's antenatal testing.  However, antenatal testing does not always predict fetal compromise in pregnancy is complicated with intrahepatic cholestasis. -Delivery is usually considered at [redacted] weeks gestation if bile acid levels are significantly increased (100 mol/L). -Recurrence rate is up to 90% and subsequent pregnancies. I counseled the patient on the diagnosis, antenatal testing, and timing of delivery.  Recommendations -Patient  will be undergoing induction of labor on 09/19/2021 at [redacted] weeks gestation.  Thank you for consultation.  If you have any questions or concerns, please contact me the [facility].  Consultation including face-to-face (more than 50%) counseling 30 minutes.

## 2021-09-16 LAB — CULTURE, BETA STREP (GROUP B ONLY): Strep Gp B Culture: NEGATIVE

## 2021-09-17 NOTE — L&D Delivery Note (Signed)
Delivery Note Shannon Trujillo is a 26 y.o. G3P2002 at [redacted]w[redacted]d admitted for IOL d/t cholestasis.   GBS Status:  Negative/-- (12/27 1615) Maximum Maternal Temperature: 98.7  Labor course: Initial SVE: 3/60/-2. Augmentation with: AROM and Pitocin. She then progressed to complete.  ROM: 1h 40m with clear fluid  Birth: CNM called to patient room for delivery. At time of CNM arrival, a viable female had been delivered via spontaneous vaginal delivery by RN. Infant placed directly on mom's abdomen for bonding/skin-to-skin, baby dried and stimulated. Cord clamped x 2 after 1 minute and cut by FOB. Cord blood collected. The placenta separated spontaneously and delivered via gentle cord traction. Pitocin infused rapidly IV per protocol. Fundus firm with massage.  Placenta inspected and appears to be intact with a 3 VC.  Placenta/Cord with the following complications: none. Cord pH: n/a Sponge and instrument count were correct x2.  Intrapartum complications:  None Anesthesia:  none Episiotomy: none Lacerations:  none Suture Repair:  n/a EBL (mL): 25   Infant: APGAR (1 MIN): 8   APGAR (5 MINS): 9   Infant weight: pending  Mom to postpartum.  Baby to Couplet care / Skin to Skin. Placenta to L&D   Plans to Breast and bottlefeed Contraception:  interval BTL Circumcision: wants inpatient  Note sent to [facility]: MCW for pp visit.   Brand Males CNM 09/19/2021 5:28 PM

## 2021-09-18 ENCOUNTER — Encounter (HOSPITAL_COMMUNITY): Payer: Self-pay | Admitting: *Deleted

## 2021-09-18 ENCOUNTER — Telehealth (HOSPITAL_COMMUNITY): Payer: Self-pay | Admitting: *Deleted

## 2021-09-18 NOTE — Telephone Encounter (Signed)
Preadmission screen  

## 2021-09-19 ENCOUNTER — Inpatient Hospital Stay (HOSPITAL_COMMUNITY): Payer: Medicaid Other

## 2021-09-19 ENCOUNTER — Inpatient Hospital Stay (HOSPITAL_COMMUNITY)
Admission: AD | Admit: 2021-09-19 | Discharge: 2021-09-21 | DRG: 805 | Disposition: A | Discharge status: Home / Self Care | Payer: Medicaid Other | Attending: Obstetrics and Gynecology | Admitting: Obstetrics and Gynecology

## 2021-09-19 ENCOUNTER — Other Ambulatory Visit: Payer: Self-pay

## 2021-09-19 ENCOUNTER — Encounter (HOSPITAL_COMMUNITY): Payer: Self-pay | Admitting: Obstetrics and Gynecology

## 2021-09-19 ENCOUNTER — Ambulatory Visit: Payer: Medicaid Other

## 2021-09-19 DIAGNOSIS — Z3A36 36 weeks gestation of pregnancy: Secondary | ICD-10-CM

## 2021-09-19 DIAGNOSIS — K831 Obstruction of bile duct: Secondary | ICD-10-CM | POA: Diagnosis present

## 2021-09-19 DIAGNOSIS — Z20822 Contact with and (suspected) exposure to covid-19: Secondary | ICD-10-CM | POA: Diagnosis present

## 2021-09-19 DIAGNOSIS — Z87891 Personal history of nicotine dependence: Secondary | ICD-10-CM

## 2021-09-19 DIAGNOSIS — Z23 Encounter for immunization: Secondary | ICD-10-CM | POA: Diagnosis not present

## 2021-09-19 DIAGNOSIS — O2662 Liver and biliary tract disorders in childbirth: Secondary | ICD-10-CM | POA: Diagnosis present

## 2021-09-19 DIAGNOSIS — O0990 Supervision of high risk pregnancy, unspecified, unspecified trimester: Secondary | ICD-10-CM

## 2021-09-19 DIAGNOSIS — Z8759 Personal history of other complications of pregnancy, childbirth and the puerperium: Secondary | ICD-10-CM

## 2021-09-19 DIAGNOSIS — O165 Unspecified maternal hypertension, complicating the puerperium: Secondary | ICD-10-CM | POA: Diagnosis present

## 2021-09-19 DIAGNOSIS — O26613 Liver and biliary tract disorders in pregnancy, third trimester: Secondary | ICD-10-CM | POA: Diagnosis present

## 2021-09-19 LAB — COMPREHENSIVE METABOLIC PANEL
ALT: 67 U/L — ABNORMAL HIGH (ref 0–44)
AST: 61 U/L — ABNORMAL HIGH (ref 15–41)
Albumin: 2.3 g/dL — ABNORMAL LOW (ref 3.5–5.0)
Alkaline Phosphatase: 185 U/L — ABNORMAL HIGH (ref 38–126)
Anion gap: 10 (ref 5–15)
BUN: 5 mg/dL — ABNORMAL LOW (ref 6–20)
CO2: 18 mmol/L — ABNORMAL LOW (ref 22–32)
Calcium: 8.2 mg/dL — ABNORMAL LOW (ref 8.9–10.3)
Chloride: 106 mmol/L (ref 98–111)
Creatinine, Ser: 0.71 mg/dL (ref 0.44–1.00)
GFR, Estimated: >60 mL/min (ref >60)
Glucose, Bld: 87 mg/dL (ref 70–99)
Potassium: 3.7 mmol/L (ref 3.5–5.1)
Sodium: 134 mmol/L — ABNORMAL LOW (ref 135–145)
Total Bilirubin: 0.6 mg/dL (ref 0.3–1.2)
Total Protein: 5.7 g/dL — ABNORMAL LOW (ref 6.5–8.1)

## 2021-09-19 LAB — CBC
HCT: 33.5 % — ABNORMAL LOW (ref 36.0–46.0)
Hemoglobin: 11.1 g/dL — ABNORMAL LOW (ref 12.0–15.0)
MCH: 27.7 pg (ref 26.0–34.0)
MCHC: 33.1 g/dL (ref 30.0–36.0)
MCV: 83.5 fL (ref 80.0–100.0)
Platelets: 154 10*3/uL (ref 150–400)
RBC: 4.01 MIL/uL (ref 3.87–5.11)
RDW: 15.8 % — ABNORMAL HIGH (ref 11.5–15.5)
WBC: 8.7 10*3/uL (ref 4.0–10.5)
nRBC: 0 % (ref 0.0–0.2)

## 2021-09-19 LAB — RESP PANEL BY RT-PCR (FLU A&B, COVID) ARPGX2
Influenza A by PCR: NEGATIVE
Influenza B by PCR: NEGATIVE
SARS Coronavirus 2 by RT PCR: NEGATIVE

## 2021-09-19 LAB — RPR: RPR Ser Ql: NONREACTIVE

## 2021-09-19 MED ORDER — OXYCODONE-ACETAMINOPHEN 5-325 MG PO TABS: 1.0000 | ORAL_TABLET | ORAL | Status: DC | PRN

## 2021-09-19 MED ORDER — SIMETHICONE 80 MG PO CHEW: 80.0000 mg | CHEWABLE_TABLET | ORAL | Status: DC | PRN

## 2021-09-19 MED ORDER — OXYTOCIN-SODIUM CHLORIDE 30-0.9 UT/500ML-% IV SOLN
2.5000 [IU]/h | INTRAVENOUS | Status: DC
  Filled 2021-09-19: qty 500

## 2021-09-19 MED ORDER — ONDANSETRON HCL 4 MG/2ML IJ SOLN: 4.0000 mg | INTRAMUSCULAR | Status: DC | PRN

## 2021-09-19 MED ORDER — ZOLPIDEM TARTRATE 5 MG PO TABS: 5.0000 mg | ORAL_TABLET | Freq: Every evening | ORAL | Status: DC | PRN

## 2021-09-19 MED ORDER — EPHEDRINE 5 MG/ML INJ: 10.0000 mg | INTRAVENOUS | Status: DC | PRN

## 2021-09-19 MED ORDER — PHENYLEPHRINE 40 MCG/ML (10ML) SYRINGE FOR IV PUSH (FOR BLOOD PRESSURE SUPPORT): 80.0000 ug | PREFILLED_SYRINGE | INTRAVENOUS | Status: DC | PRN

## 2021-09-19 MED ORDER — ONDANSETRON HCL 4 MG/2ML IJ SOLN: 4.0000 mg | Freq: Four times a day (QID) | INTRAMUSCULAR | Status: DC | PRN

## 2021-09-19 MED ORDER — ONDANSETRON HCL 4 MG PO TABS: 4.0000 mg | ORAL_TABLET | ORAL | Status: DC | PRN

## 2021-09-19 MED ORDER — SENNOSIDES-DOCUSATE SODIUM 8.6-50 MG PO TABS
2.0000 | ORAL_TABLET | Freq: Every day | ORAL | Status: DC
  Administered 2021-09-21: 2 via ORAL
  Filled 2021-09-19 (×2): qty 2

## 2021-09-19 MED ORDER — COCONUT OIL OIL: 1.0000 "application " | TOPICAL_OIL | Status: DC | PRN

## 2021-09-19 MED ORDER — BENZOCAINE-MENTHOL 20-0.5 % EX AERO: 1.0000 "application " | INHALATION_SPRAY | CUTANEOUS | Status: DC | PRN

## 2021-09-19 MED ORDER — ACETAMINOPHEN 325 MG PO TABS: 650.0000 mg | ORAL_TABLET | ORAL | Status: DC | PRN

## 2021-09-19 MED ORDER — TRANEXAMIC ACID-NACL 1000-0.7 MG/100ML-% IV SOLN
1000.0000 mg | Freq: Once | INTRAVENOUS | Status: AC
Start: 2021-09-19 — End: 2021-09-19
  Administered 2021-09-19: 1000 mg via INTRAVENOUS
  Filled 2021-09-19: qty 100

## 2021-09-19 MED ORDER — LACTATED RINGERS IV SOLN: 500.0000 mL | Freq: Once | INTRAVENOUS | Status: DC

## 2021-09-19 MED ORDER — OXYTOCIN-SODIUM CHLORIDE 30-0.9 UT/500ML-% IV SOLN
1.0000 m[IU]/min | INTRAVENOUS | Status: DC
  Administered 2021-09-19: 2 m[IU]/min via INTRAVENOUS

## 2021-09-19 MED ORDER — SOD CITRATE-CITRIC ACID 500-334 MG/5ML PO SOLN: 30.0000 mL | ORAL | Status: DC | PRN

## 2021-09-19 MED ORDER — LACTATED RINGERS IV SOLN: INTRAVENOUS | Status: DC

## 2021-09-19 MED ORDER — WITCH HAZEL-GLYCERIN EX PADS: 1.0000 "application " | MEDICATED_PAD | CUTANEOUS | Status: DC | PRN

## 2021-09-19 MED ORDER — OXYCODONE-ACETAMINOPHEN 5-325 MG PO TABS: 2.0000 | ORAL_TABLET | ORAL | Status: DC | PRN

## 2021-09-19 MED ORDER — FENTANYL CITRATE (PF) 100 MCG/2ML IJ SOLN
100.0000 ug | INTRAMUSCULAR | Status: DC | PRN
  Administered 2021-09-19: 100 ug via INTRAVENOUS
  Filled 2021-09-19: qty 2

## 2021-09-19 MED ORDER — PRENATAL MULTIVITAMIN CH
1.0000 | ORAL_TABLET | Freq: Every day | ORAL | Status: DC
  Administered 2021-09-20 – 2021-09-21 (×2): 1 via ORAL
  Filled 2021-09-19 (×2): qty 1

## 2021-09-19 MED ORDER — LIDOCAINE HCL (PF) 1 % IJ SOLN: 30.0000 mL | INTRAMUSCULAR | Status: DC | PRN

## 2021-09-19 MED ORDER — DIPHENHYDRAMINE HCL 25 MG PO CAPS: 25.0000 mg | ORAL_CAPSULE | Freq: Four times a day (QID) | ORAL | Status: DC | PRN

## 2021-09-19 MED ORDER — LACTATED RINGERS IV SOLN: 500.0000 mL | INTRAVENOUS | Status: DC | PRN

## 2021-09-19 MED ORDER — TETANUS-DIPHTH-ACELL PERTUSSIS 5-2.5-18.5 LF-MCG/0.5 IM SUSY
0.5000 mL | PREFILLED_SYRINGE | Freq: Once | INTRAMUSCULAR | Status: AC
  Administered 2021-09-21: 0.5 mL via INTRAMUSCULAR
  Filled 2021-09-19: qty 0.5

## 2021-09-19 MED ORDER — TERBUTALINE SULFATE 1 MG/ML IJ SOLN: 0.2500 mg | Freq: Once | INTRAMUSCULAR | Status: DC | PRN

## 2021-09-19 MED ORDER — FENTANYL-BUPIVACAINE-NACL 0.5-0.125-0.9 MG/250ML-% EP SOLN: 12.0000 mL/h | EPIDURAL | Status: DC | PRN

## 2021-09-19 MED ORDER — OXYTOCIN BOLUS FROM INFUSION
333.0000 mL | Freq: Once | INTRAVENOUS | Status: AC
  Administered 2021-09-19: 333 mL via INTRAVENOUS

## 2021-09-19 MED ORDER — DIBUCAINE (PERIANAL) 1 % EX OINT: 1.0000 "application " | TOPICAL_OINTMENT | CUTANEOUS | Status: DC | PRN

## 2021-09-19 MED ORDER — DIPHENHYDRAMINE HCL 50 MG/ML IJ SOLN: 12.5000 mg | INTRAMUSCULAR | Status: DC | PRN

## 2021-09-19 MED ORDER — IBUPROFEN 600 MG PO TABS
600.0000 mg | ORAL_TABLET | Freq: Four times a day (QID) | ORAL | Status: DC
  Administered 2021-09-20 – 2021-09-21 (×6): 600 mg via ORAL
  Filled 2021-09-19 (×7): qty 1

## 2021-09-19 NOTE — Lactation Note (Signed)
This note was copied from a baby's chart. Lactation Consultation Note  Patient Name: Shannon Trujillo [id number] Date: 09/19/2021 Reason for consult: L&D Initial assessment;Mother's request;1st time breastfeeding;Late-preterm 34-36.6wks;Breastfeeding assistance Age:24 hours  LC assisted with latching infant at the breast with signs of milk transfer. Mom first time breastfeeding, feeding plan breast and formula.  Mom to receive further LC support once on the floor.  All questions answered at the end of the visit.   Maternal Data    Feeding Mother's Current Feeding Choice: Breast Milk  LATCH Score Latch: Repeated attempts needed to sustain latch, nipple held in mouth throughout feeding, stimulation needed to elicit sucking reflex.  Audible Swallowing: Spontaneous and intermittent  Type of Nipple: Everted at rest and after stimulation  Comfort (Breast/Nipple): Soft / non-tender  Hold (Positioning): Assistance needed to correctly position infant at breast and maintain latch.  LATCH Score: 8   Lactation Tools Discussed/Used    Interventions Interventions: Breast feeding basics reviewed;Assisted with latch;Skin to skin;Breast massage;Breast compression;Adjust position;Education  Discharge    Consult Status Consult Status: Follow-up from L&D Date: 09/20/21 Follow-up type: In-patient    Marrell Dicaprio  Nicholson-Springer 09/19/2021, 6:03 PM

## 2021-09-19 NOTE — Progress Notes (Addendum)
Shannon Trujillo is a 26 y.o. G3P2002 at [redacted]w[redacted]d by LMP admitted for induction of labor due to cholestasis of pregnancy.  Subjective: Reports feeling well, no questions or concerns at this time. Family member and FOB at the bedside.   Objective: BP 120/73    Pulse (!) 51    Temp 98.4 F (36.9 C) (Oral)    Resp 18    Ht 5\' 4"  (1.626 m)    Wt 60.5 kg    LMP 01/09/2021 (Approximate)    SpO2 100%    BMI 22.90 kg/m  No intake/output data recorded. No intake/output data recorded.  FHT:  FHR: 135 bpm, variability: moderate,  accelerations:  Present,  decelerations:  Absent UC:   regular, every 2-3 minutes SVE:   Dilation: 3 Effacement (%): 70 Station: -2 Exam by:: Bridgette Habermann, CNM  Labs: Lab Results  Component Value Date   WBC 8.7 09/19/2021   HGB 11.1 (L) 09/19/2021   HCT 33.5 (L) 09/19/2021   MCV 83.5 09/19/2021   PLT 154 09/19/2021    Assessment / Plan: Induction of labor due to cholestasis of pregnancy with severely elevated bile acids,  progressing well on pitocin. AROM completed at last cervical check at ~1545, clear fluid.   Labor: Progressing normally, on pitocin now s/p AROM Fetal Wellbeing:  Category I Pain Control:  Labor support without medications, would like epidural if pain becomes intolerable I/D:  n/a Anticipated MOD:  NSVD  SIRINE GLORIA 09/19/2021, 3:46 PM   Attestation of CNM Supervision of Resident: Evaluation and management procedures were performed by the [facility] Resident under my supervision. I was immediately available for direct supervision, assistance and direction throughout this encounter.  I also confirm that I have verified the information documented in the residents note, and that I have also personally reperformed the pertinent components of the physical exam and all of the medical decision making activities.  I have also made any necessary editorial changes.  I performed SVE and AROM. Both patient and baby tolerated procedure well. AROM  with moderate amount of clear fluid. Continue Pitocin titration prn.  Renee Harder, CNM 09/19/2021 4:04 PM

## 2021-09-19 NOTE — H&P (Addendum)
OBSTETRIC ADMISSION HISTORY AND PHYSICAL  Shannon Trujillo is a 26 y.o. female G3P2002 with IUP at 83w1dby unsure LMP presenting for IOL for uncontrolled cholestasis of pregnancy. She reports +FMs, No LOF, no VB, no blurry vision, headaches or peripheral edema, and RUQ pain.  She plans on breast and bottle feeding. She requests BTL for birth control. She did not receive adequate prenatal care this pregnancy, established with [facility] at 326w2d  Dating: By unsure LMP --->  Estimated Date of Delivery: 10/16/21  Sono:    _0 , CWD, normal anatomy, cephalic presentation, posterior lie, 2488 g, 25% EFW   Prenatal History/Complications: Cholestasis of pregnancy, late prenatal care, history of PPH and reported history of pregnancy induced hypertension  Past Medical History: Past Medical History:  Diagnosis Date   Anemia in pregnancy 02/18/2018   Cholestasis of pregnancy    Postpartum hemorrhage 12/03/2015    Past Surgical History: Past Surgical History:  Procedure Laterality Date   NO PAST SURGERIES      Obstetrical History: OB History     Gravida  3   Para  2   Term  2   Preterm  0   AB  0   Living  2      SAB  0   IAB  0   Ectopic  0   Multiple  0   Live Births  2           Social History Social History   Socioeconomic History   Marital status: Single    Spouse name: Not on file   Number of children: Not on file   Years of education: Not on file   Highest education level: Not on file  Occupational History   Not on file  Tobacco Use   Smoking status: Former    Types: E-cigarettes   Smokeless tobacco: Former  VaScientific laboratory technicianse: Every day   Substances: Nicotine, Flavoring  Substance and Sexual Activity   Alcohol use: No   Drug use: No   Sexual activity: Yes  Other Topics Concern   Not on file  Social History Narrative   Not on file   Social Determinants of Health   Financial Resource Strain: Not on file  Food Insecurity: No  Food Insecurity   Worried About RuCharity fundraisern the Last Year: Never true   RaNara Visan the Last Year: Never true  Transportation Needs: No Transportation Needs   Lack of Transportation (Medical): No   Lack of Transportation (Non-Medical): No  Physical Activity: Not on file  Stress: Not on file  Social Connections: Not on file    Family History: Family History  Problem Relation Age of Onset   Diabetes Paternal Grandmother    Anemia Mother     Allergies: No Known Allergies  Medications Prior to Admission  Medication Sig Dispense Refill Last Dose   hydrOXYzine (ATARAX) 10 MG tablet Take 1 tablet (10 mg total) by mouth 3 (three) times daily as needed. 30 tablet 0 Past Week   Prenatal 27-1 MG TABS Take 1 tablet by mouth daily. 30 tablet 8 09/18/2021   ursodiol (ACTIGALL) 500 MG tablet Take 1 tablet (500 mg total) by mouth in the morning and at bedtime. 60 tablet 1 09/18/2021   Blood Pressure Monitoring (BLOOD PRESSURE KIT) DEVI 1 Device by Does not apply route daily. 1 each 0      Review of Systems   All systems  reviewed and negative except as stated in HPI  Blood pressure (!) 129/96, pulse 81, temperature 98.3 F (36.8 C), temperature source Oral, resp. rate 18, height 5' 4" (1.626 m), weight 60.5 kg, last menstrual period 01/09/2021, SpO2 100 %. General appearance: alert, cooperative, appears stated age, and no distress Lungs: clear to auscultation bilaterally Heart: regular rate and rhythm Abdomen: soft, non-tender; bowel sounds normal Pelvic: Completed by nursing Extremities: Homans sign is negative, no sign of DVT DTR's normal Presentation:  Unknown Fetal monitoringBaseline: 135 bpm, Variability: Good {> 6 bpm), Accelerations: Reactive, and Decelerations: Absent Uterine activity: Irritability with q5-7 minute contractions  Dilation: 3 Effacement (%): 60 Station: -2 Exam by:: H.Price, RN   Prenatal labs: ABO, Rh: --/--/AB POS (01/03 0830) Antibody: NEG  (01/03 0830) Rubella: 3.46 (11/19 1828) RPR: NON REACTIVE (11/19 1828)  HBsAg: NON REACTIVE (11/19 1828)  HIV: Non Reactive (11/19 1828)  GBS: Negative/-- (12/27 1615)  1 hr Glucola unknown; random blood glucose within normal limits with A1c 4.8% Genetic screening  not completed Anatomy US normal but limited due to advanced gestational age  Prenatal Transfer Tool  Maternal Diabetes: No Genetic Screening: Declined Maternal Ultrasounds/Referrals: Normal Fetal Ultrasounds or other Referrals:  Referred to Materal Fetal Medicine  Maternal Substance Abuse:  Yes:  Type: Other: Vaping Significant Maternal Medications:  Meds include: Other: Ursodiol, hydroxyzine, PNV Significant Maternal Lab Results: Other: Alk phos, AST and ALT elevated  Results for orders placed or performed during the hospital encounter of 09/19/21 (from the past 24 hour(s))  Resp Panel by RT-PCR (Flu A&B, Covid) Nasopharyngeal Swab   Collection Time: 09/19/21  8:30 AM   Specimen: Nasopharyngeal Swab; Nasopharyngeal(NP) swabs in vial transport medium  Result Value Ref Range   SARS Coronavirus 2 by RT PCR NEGATIVE NEGATIVE   Influenza A by PCR NEGATIVE NEGATIVE   Influenza B by PCR NEGATIVE NEGATIVE  CBC   Collection Time: 09/19/21  8:30 AM  Result Value Ref Range   WBC 8.7 4.0 - 10.5 K/uL   RBC 4.01 3.87 - 5.11 MIL/uL   Hemoglobin 11.1 (L) 12.0 - 15.0 g/dL   HCT 33.5 (L) 36.0 - 46.0 %   MCV 83.5 80.0 - 100.0 fL   MCH 27.7 26.0 - 34.0 pg   MCHC 33.1 30.0 - 36.0 g/dL   RDW 15.8 (H) 11.5 - 15.5 %   Platelets 154 150 - 400 K/uL   nRBC 0.0 0.0 - 0.2 %  Comprehensive metabolic panel   Collection Time: 09/19/21  8:30 AM  Result Value Ref Range   Sodium 134 (L) 135 - 145 mmol/L   Potassium 3.7 3.5 - 5.1 mmol/L   Chloride 106 98 - 111 mmol/L   CO2 18 (L) 22 - 32 mmol/L   Glucose, Bld 87 70 - 99 mg/dL   BUN 5 (L) 6 - 20 mg/dL   Creatinine, Ser 0.71 0.44 - 1.00 mg/dL   Calcium 8.2 (L) 8.9 - 10.3 mg/dL   Total  Protein 5.7 (L) 6.5 - 8.1 g/dL   Albumin 2.3 (L) 3.5 - 5.0 g/dL   AST 61 (H) 15 - 41 U/L   ALT 67 (H) 0 - 44 U/L   Alkaline Phosphatase 185 (H) 38 - 126 U/L   Total Bilirubin 0.6 0.3 - 1.2 mg/dL   GFR, Estimated >60 >60 mL/min   Anion gap 10 5 - 15  Type and screen   Collection Time: 09/19/21  8:30 AM  Result Value Ref Range   ABO/RH(D) AB  POS    Antibody Screen NEG    Sample Expiration      09/22/2021,2359 Performed at [facility], [place] [address] [place], [place] [address]     Patient Active Problem List   Diagnosis Date Noted   Supervision of high risk pregnancy, antepartum 09/06/2021   Cholestasis during pregnancy in third trimester 09/07/2021   History of pregnancy induced hypertension 09/06/2021    Assessment/Plan:  YALENA COLON is a 26 y.o. G3P2002 at 90w1dhere for IOL due to significant cholestasis of pregnancy.   #Labor: Admit for labor, will start IVF with LR 125 cc/hr, clear liquid diet, will start low-dose pitocin. Patient is in agreement with plan.  #Late prenatal care: Social work to be involved #History of pregnancy induced hypertension: Blood pressures are currently well-controlled. No antihypertensives used this pregnancy. Continue to monitor #Pain: Desires natural labor but would consider epidural if pain becomes intolerable #FWB: Cat 1 #ID:  N/A #MOF: Breast and bottle #MOC: Desires BTL #Circ:  Yes  AFabiola Backer MD  09/19/2021, 10:24 AM   Attestation of CNM Supervision of Resident: Evaluation and management procedures were performed by the [facility] Resident under my supervision. I was immediately available for direct supervision, assistance and direction throughout this encounter. I also confirm that I have verified the information documented in the residents note, and that I have also personally reperformed the pertinent components of the physical exam and all of the medical decision making activities.  I have also made any  necessary editorial changes.  DRenee Harder CNM 09/19/2021 12:51 PM

## 2021-09-19 NOTE — Progress Notes (Signed)
Spoke with Dr. Ephriam Jenkins regarding pt BP, initially 147/90 and 1 hour later 135/90. Pt asymptomatic. No new orders at this time. Will continue to monitor and if the following BP is 140s/90s, this RN was told to contact Dr. Ephriam Jenkins again.

## 2021-09-19 NOTE — Lactation Note (Signed)
This note was copied from a baby's chart. Lactation Consultation Note SCN RN called d/t baby's glucose is 38. LC went to L&D room. Mom in w/c fixing to go up to [facility]. Explained to mom that baby's glucose is low and we needed to supplement. Options were hand express colostrum, Donor BM or formula. Mom said formula.  SCN RN notified saying give formula when comes to [facility].  Patient Name: Shannon Trujillo [id number] Date: 09/19/2021 Reason for consult: L&D Initial assessment;Mother's request;1st time breastfeeding;Late-preterm 34-36.6wks;Breastfeeding assistance Age:55 hours  Maternal Data    Feeding Mother's Current Feeding Choice: Breast Milk and Formula  LATCH Score Latch: Repeated attempts needed to sustain latch, nipple held in mouth throughout feeding, stimulation needed to elicit sucking reflex.  Audible Swallowing: Spontaneous and intermittent  Type of Nipple: Everted at rest and after stimulation  Comfort (Breast/Nipple): Soft / non-tender  Hold (Positioning): Assistance needed to correctly position infant at breast and maintain latch.  LATCH Score: 8   Lactation Tools Discussed/Used    Interventions Interventions: Breast feeding basics reviewed;Assisted with latch;Skin to skin;Breast massage;Breast compression;Adjust position;Education  Discharge    Consult Status Consult Status: Follow-up from L&D Date: 09/20/21 Follow-up type: In-patient    Charyl Dancer 09/19/2021, 7:58 PM

## 2021-09-19 NOTE — Discharge Summary (Signed)
Postpartum Discharge Summary     Patient Name: Shannon Trujillo DOB: 26-Jun-1996 MRN: [id number]  Date of admission: 09/19/2021 Delivery date:09/19/2021  Delivering provider: Melina Fiddler D  Date of discharge: 09/21/2021  Admitting diagnosis: Cholestasis during pregnancy [O26.619, K83.1] Intrauterine pregnancy: [redacted]w[redacted]d    Secondary diagnosis:  Active Problems:   SVD (spontaneous vaginal delivery)   Supervision of high risk pregnancy, antepartum   History of pregnancy induced hypertension   Cholestasis during pregnancy in third trimester   Postpartum hypertension  Additional problems: None    Discharge diagnosis: Term Pregnancy Delivered                                              Post partum procedures: None Augmentation: AROM and Pitocin Complications: None  Hospital course: Induction of Labor With Vaginal Delivery   26y.o. yo G3P2002 at 358w1das admitted to the hospital 09/19/2021 for induction of labor.  Indication for induction:  cholestasis .  Patient had an uncomplicated labor course as follows: Membrane Rupture Time/Date: 3:44 PM ,09/19/2021   Delivery Method:Vaginal, Spontaneous  Episiotomy: None  Lacerations:  None  Details of delivery can be found in separate delivery note.  Patient had a routine postpartum course. She had blood pressures >135s and >95s so was started on Procardia 301mPatient is discharged home 09/21/21.  Newborn Data: Birth date:09/19/2021  Birth time:4:59 PM  Gender:Female  Living status:Living  Apgars:8 ,9  Weight:2710 g   Magnesium Sulfate received: No BMZ received: No Rhophylac:N/A MMR:N/A T-DaP: would like PP prior to discharge- ordered and discussed with RN to provide prior to discharge Flu: No Transfusion:No  Physical exam  Vitals:   09/20/21 2126 09/20/21 2153 09/21/21 0504 09/21/21 0556  BP: (!) 130/91 118/88 122/90 (!) 119/98  Pulse: 81 92 81 83  Resp: '17  16 17  ' Temp: 98.2 F (36.8 C)  97.8 F (36.6 C) 98.2 F (36.8 C)   TempSrc: Oral  Oral Oral  SpO2: 99% 99% 98% 99%  Weight:      Height:       General: alert Lochia: appropriate Uterine Fundus: firm Incision: N/A DVT Evaluation: No evidence of DVT seen on physical exam. Labs: Lab Results  Component Value Date   WBC 8.7 09/19/2021   HGB 11.1 (L) 09/19/2021   HCT 33.5 (L) 09/19/2021   MCV 83.5 09/19/2021   PLT 154 09/19/2021   CMP Latest Ref Rng & Units 09/19/2021  Glucose 70 - 99 mg/dL 87  BUN 6 - 20 mg/dL 5(L)  Creatinine 0.44 - 1.00 mg/dL 0.71  Sodium 135 - 145 mmol/L 134(L)  Potassium 3.5 - 5.1 mmol/L 3.7  Chloride 98 - 111 mmol/L 106  CO2 22 - 32 mmol/L 18(L)  Calcium 8.9 - 10.3 mg/dL 8.2(L)  Total Protein 6.5 - 8.1 g/dL 5.7(L)  Total Bilirubin 0.3 - 1.2 mg/dL 0.6  Alkaline Phos 38 - 126 U/L 185(H)  AST 15 - 41 U/L 61(H)  ALT 0 - 44 U/L 67(H)   EdiFlavia Shipperore: Edinburgh Postnatal Depression Scale Screening Tool 09/19/2021  I have been able to laugh and see the funny side of things. (No Data)  I have looked forward with enjoyment to things. -  I have blamed myself unnecessarily when things went wrong. -  I have been anxious or worried for no good reason. -  I  have felt scared or panicky for no good reason. -  Things have been getting on top of me. -  I have been so unhappy that I have had difficulty sleeping. -  I have felt sad or miserable. -  I have been so unhappy that I have been crying. -  The thought of harming myself has occurred to me. Flavia Shipper Postnatal Depression Scale Total -     After visit meds:  Allergies as of 09/21/2021   No Known Allergies      Medication List     STOP taking these medications    hydrOXYzine 10 MG tablet Commonly known as: ATARAX   ursodiol 500 MG tablet Commonly known as: ACTIGALL       TAKE these medications    acetaminophen 325 MG tablet Commonly known as: Tylenol Take 2 tablets (650 mg total) by mouth every 4 (four) hours as needed (for pain scale < 4).   Blood  Pressure Kit Devi 1 Device by Does not apply route daily.   ibuprofen 600 MG tablet Commonly known as: ADVIL Take 1 tablet (600 mg total) by mouth every 6 (six) hours.   NIFEdipine 30 MG 24 hr tablet Commonly known as: ADALAT CC Take 1 tablet (30 mg total) by mouth daily.   Prenatal 27-1 MG Tabs Take 1 tablet by mouth daily.               Durable Medical Equipment  (From admission, onward)           Start     Ordered   09/20/21 1515  For home use only DME double electric breast pump  Once       Comments: ICD-10 code Z39.1   09/20/21 1515             Discharge home in stable condition Infant Feeding: Bottle and Breast Infant Disposition:home with mother Discharge instruction: per After Visit Summary and Postpartum booklet. Activity: Advance as tolerated. Pelvic rest for 6 weeks.  Diet: routine diet Future Appointments: Future Appointments  Date Time Provider [place]  09/27/2021 10:00 AM [facility] NURSE [facility]  10/09/2021 10:15 AM Aletha Halim, MD [facility]  11/01/2021  9:15 AM Caren Macadam, MD [facility]   Follow up Visit:  Message sent to [facility] by Dr. Cy Blamer on 1/4  Please schedule this patient for a In person postpartum visit in 6 weeks with the following provider: MD. Additional Postpartum F/U:Colpo  and 1 week PP BP check High risk pregnancy complicated by:  cholestasis Delivery mode:  Vaginal, Spontaneous  Anticipated Birth Control:  Plans Interval BTL  Renard Matter, MD, MPH OB Fellow, Faculty Practice

## 2021-09-20 DIAGNOSIS — O165 Unspecified maternal hypertension, complicating the puerperium: Secondary | ICD-10-CM

## 2021-09-20 LAB — TYPE AND SCREEN
ABO/RH(D): AB POS
Antibody Screen: NEGATIVE

## 2021-09-20 MED ORDER — NIFEDIPINE ER OSMOTIC RELEASE 30 MG PO TB24
30.0000 mg | ORAL_TABLET | Freq: Every day | ORAL | Status: DC
  Administered 2021-09-20 – 2021-09-21 (×2): 30 mg via ORAL
  Filled 2021-09-20 (×2): qty 1

## 2021-09-20 NOTE — Clinical Social Work Maternal (Addendum)
CLINICAL SOCIAL WORK MATERNAL/CHILD NOTE  Patient Details  Name: Shannon Trujillo MRN: [id number] Date of Birth: 1996/03/07  Date:  09/20/2021  Clinical Social Worker Initiating Note:  Shannon Trujillo, [place] Date/Time: Initiated:  09/20/21/0959     Child's Name:  Shannon Greathouse, Shannon Trujillo   Biological Parents:  Mother, Father (MOB [place] Warriner 05/07/98, FOB: Lytle [place] 09-03-1996)   Need for Interpreter:  None   Reason for Referral:  Late or No Prenatal Care     Address:  [place] [address]    Phone number:  [phone] (home)     Additional phone number:   Household Members/Support Persons (HM/SP):   Household Member/Support Person 1, Household Member/Support Person 2   HM/SP Name Relationship DOB or Age  HM/SP -1 Lytle Butte Significant Other 09-03-1996  HM/SP -2 Asa Lente Daughter 03/24/2018  HM/SP -3        HM/SP -4        HM/SP -5        HM/SP -6        HM/SP -7        HM/SP -8          Natural Supports (not living in the home):  Parent   Professional Supports: None   Employment: Full-time   Type of Work: Chartered loss adjuster   Education:  [place] arranged:    Museum/gallery curator Resources:  Medicaid   Other Resources:  Food Stamps  /MOB reported she has the contact information to follow up with [facility] services.   Cultural/Religious Considerations Which May Impact Care:    Strengths:  Ability to meet basic needs  , Home prepared for child  , Pediatrician chosen   Psychotropic Medications:         Pediatrician:    [place]  Pediatrician List:   [place] ([place])  [place]      Pediatrician Fax Number:    Risk Factors/Current Problems:      Cognitive State:  Able to Concentrate  , Alert  , Linear Thinking  , Insightful     Mood/Affect:  Comfortable     CSW  Assessment: CSW received consult for late [facility].  CSW met with MOB to offer support and complete assessment.    CSW met with MOB at bedside and introduced CSW role. CSW observed MOB sitting up in the bed holding and appropriate with the infant. MOB presented calm and receptive to [place] visit. MOB confirmed the demographic information on file is correct. MOB reported she live with her daughter Asa Lente and FOB Lytle Butte (see chart above). MOB identified FOB and her mom as supports. MOB reported her first child was adopted. CSW inquired how MOB has felt since giving birth. MOB shared feeling fine. MOB shared she was induced due to her cholestasis diagnosis but overall she fine during the pregnancy.   CSW inquired about MOB late [facility]. MOB shared that she did know she was pregnant until about 20 weeks and had a difficult time finding a OBGYN to provide care that late. CSW informed MOB about the hospital drug screen policy. CSW made MOB aware that CSW will follow infants CDS and make a report, if warranted. MOB reported understanding and had no questions. CSW inquired if MOB has CPS  history. MOB reported CPS with [facility]. MOB disclosed that she had a case in January 2022, a report was filed by her oldest child  father Carlynn Herald) that MOB was using weed and cocaine in front of the child. MOB reported that child's father made the report because he was upset about them ending their relationship. MOB denied any substance use. MOB reported she had to have a drug test completed which was negative and the case was closed. MOB shared another report was filed by the child's father in October 2022, that claimed MOB was abusing and neglecting the child. MOB reported the case was not substantiated and closed. MOB reported she still has custody of her daughter, Randall Hiss.   CSW inquired if MOB has mental health history. MOB reported no mental health history. MOB denied PPD. CSW provided education regarding the  baby blues period vs. perinatal mood disorders, discussed treatment and gave resources for mental health follow up if concerns arise.  CSW recommended MOB complete a self-evaluation during the postpartum time period using the New Mom Checklist from Postpartum Progress and encouraged MOB to contact a medical professional if symptoms are noted at any time. MOB denied thoughts of harm to self and others.   MOB reported she has essential items for the infant including a bassinet where the infant will sleep. CSW provided review of Sudden Infant Death Syndrome (SIDS) precautions.  MOB has chosen [facility] for infant's follow up care and will have transportation. CSW assessed MOB for additional needs. MOB reported no further needs.   CSW identifies no further need for intervention and no barriers to discharge at this time.  CSW Plan/Description:  Sudden Infant Death Syndrome (SIDS) Education, CSW Will Continue to Monitor Umbilical Cord Tissue Drug Screen Results and Make Report if Warranted, [place], Perinatal Mood and Anxiety Disorder (PMADs) Education, No Further Intervention Required/No Barriers to Discharge    Lia Hopping, Shannon Trujillo 09/20/2021, 12:29PM

## 2021-09-20 NOTE — Lactation Note (Signed)
This note was copied from a baby's chart. Lactation Consultation Note  Patient Name: Shannon Trujillo [id number] Date: 09/20/2021 Reason for consult: Initial assessment;Late-preterm 34-36.6wks;Breastfeeding assistance Age:24 hours  Lactation conducted an initial consult with Ms. Shannon Trujillo and her 67 hour old son. She states that she has been breast feeding and supplementing. She has not breastfed before. She states that baby is able to latch to the breast.  The last feeding was around 0500, and I helped to wake baby, change a meconium diaper, and position in football hold to the left breast. Baby was too sleepy to latch even after diaper change.  I reviewed LPI guidelines and recommended supplementation of 10 mls. I then reviewed DEBP settings and education. I recommended pumping.  Plan: Offer breast first with each feeding. Supplement and post-pump after as per LPI guidelines.   Ms. Shannon Trujillo is interested in a Stork Pump if she is eligible. I agreed to assist with the request.  Maternal Data Has patient been taught Hand Expression?: Yes Does the patient have breastfeeding experience prior to this delivery?: No (#2 child - mom states PPH prevented breastfeeding)  Feeding Mother's Current Feeding Choice: Breast Milk and Formula  LATCH Score Latch: Too sleepy or reluctant, no latch achieved, no sucking elicited.  Audible Swallowing: None  Type of Nipple: Everted at rest and after stimulation  Comfort (Breast/Nipple): Soft / non-tender  Hold (Positioning): Assistance needed to correctly position infant at breast and maintain latch.  LATCH Score: 5   Lactation Tools Discussed/Used Tools: Pump Breast pump type: Double-Electric Breast Pump Pump Education: Setup, frequency, and cleaning Reason for Pumping: LPI Pumping frequency: rec q3 hours  Interventions Interventions: Breast feeding basics reviewed;Assisted with latch;Skin to skin;Adjust position;Support pillows;Position  options;Education;DEBP  Discharge Pump: DEBP (referring to [facility] Pump)  Consult Status Consult Status: Follow-up Date: 09/21/21 Follow-up type: In-patient    Walker Shadow 09/20/2021, 10:01 AM

## 2021-09-20 NOTE — Progress Notes (Signed)
POSTPARTUM PROGRESS NOTE  Subjective: KEVONA LUPINACCI is a 26 y.o. [phone] s/p SVD at [redacted]w[redacted]d.  She reports she doing well. No acute events overnight. She denies any problems with ambulating, voiding or po intake. Denies nausea or vomiting. She has  passed flatus. Pain is well controlled.  Lochia is scant.  Objective: Blood pressure (!) 130/93, pulse 62, temperature 98.4 F (36.9 C), temperature source Oral, resp. rate 18, height 5\' 4"  (1.626 m), weight 60.5 kg, last menstrual period 01/09/2021, SpO2 98 %, unknown if currently breastfeeding.  Physical Exam:  General: alert, cooperative and no distress Chest: no respiratory distress Abdomen: soft, non-tender  Uterine Fundus: firm, appropriately tender Extremities: No calf swelling or tenderness   no edema  Recent Labs    09/19/21 0830  HGB 11.1*  HCT 33.5*    Assessment/Plan: VENTURA LEGGITT is a 26 y.o. [phone] s/p SVD at [redacted]w[redacted]d for.  Routine Postpartum Care: Doing well, pain well-controlled.  -- Continue routine care, lactation support  -- Contraception: interval tubal -- Feeding: both  #Post partum hypertension Patient with 3 BP with systolic in 90s since delivery. Will start Procardia 30mg    Dispo: Plan for discharge on PPD# 2 as baby staying since preterm.  [redacted]w[redacted]d, MD, MPH OB Fellow, [facility] for [facility]

## 2021-09-21 ENCOUNTER — Other Ambulatory Visit (HOSPITAL_COMMUNITY): Payer: Self-pay

## 2021-09-21 MED ORDER — ACETAMINOPHEN 325 MG PO TABS
650.0000 mg | ORAL_TABLET | ORAL | 0 refills | Status: DC | PRN
  Filled 2021-09-21: qty 60, 5d supply, fill #0

## 2021-09-21 MED ORDER — NIFEDIPINE ER 30 MG PO TB24
30.0000 mg | ORAL_TABLET | Freq: Every day | ORAL | 0 refills | Status: DC
  Filled 2021-09-21: qty 60, 60d supply, fill #0

## 2021-09-21 MED ORDER — IBUPROFEN 600 MG PO TABS
600.0000 mg | ORAL_TABLET | Freq: Four times a day (QID) | ORAL | 0 refills | Status: DC
  Filled 2021-09-21: qty 30, 8d supply, fill #0

## 2021-09-21 NOTE — Lactation Note (Signed)
This note was copied from a baby's chart. Lactation Consultation Note  Patient Name: Boy Vernida Pownell [id number] Date: 09/21/2021 Reason for consult: Follow-up assessment Age:24 hours   LC Note:  Attempted to visit with family, however, mother and baby were asleep.  Spoke with father and let him know I will return later for a visit.   Maternal Data    Feeding Nipple Type: Slow - flow  LATCH Score                    Lactation Tools Discussed/Used    Interventions    Discharge    Consult Status Consult Status: Follow-up Date: 09/21/21 Follow-up type: In-patient    Maude Hettich R Jaymon Dudek 09/21/2021, 8:48 AM

## 2021-09-21 NOTE — Lactation Note (Signed)
This note was copied from a baby's chart. Lactation Consultation Note  Patient Name: Shannon Trujillo [id number] Date: 09/21/2021 Reason for consult: Follow-up assessment;Late-preterm 34-36.6wks;1st time breastfeeding Age:24 hours   P3 mother whose infant is now 30 hours old.  This is a LPTI at 36+1 weeks.  Mother's current feeding preference is breast/formula.  This is mother's first time breast feeding.  Baby was in the nursery receiving a circumcision when I arrived.  Reviewed current feeding plan with parents.  Mother reported that baby has been showing feeding cues and latching well.  She has been supplementing with appropriate volumes; encouraged 20+mls today after breast feeding.  Encouraged to call for latch assistance as needed.  Discussed the possibility of a sleepy baby after circumcision.  Reminded parents to awaken at three hours for feedings if he does not self awaken.  Informed parents that he may "cluster feed" tonight if he remains sleepy during the day time today.  Parents verbalized understanding.  Mother has received her Stork pump.  Mother had no questions/concerns related to pumping.     Maternal Data Has patient been taught Hand Expression?: Yes Does the patient have breastfeeding experience prior to this delivery?: No  Feeding Mother's Current Feeding Choice: Breast Milk and Formula  LATCH Score                    Lactation Tools Discussed/Used Breast pump type: Double-Electric Breast Pump;Manual Pump Education: Setup, frequency, and cleaning (No review needed) Reason for Pumping: Breast stimulation for supplementation for LPTI Pumping frequency: Every three hours  Interventions Interventions: Education  Discharge Pump: Personal;DEBP;Manual;Stork Pump  Consult Status Consult Status: Follow-up Date: 09/22/21 Follow-up type: In-patient    Journee Bobrowski R Roemello Speyer 09/21/2021, 10:57 AM

## 2021-09-21 NOTE — Social Work (Signed)
CSW confirmed with [facility] CPS intake Bernie Covey, MOB does not have any open CPS cases.  No barriers to discharge.   Vivi Barrack, MSW, LCSW [facility]  Clinical Social Worker  [phone] 09/21/2021  9:23 AM

## 2021-09-22 ENCOUNTER — Encounter: Payer: Medicaid Other | Admitting: Obstetrics and Gynecology

## 2021-09-22 ENCOUNTER — Ambulatory Visit: Payer: Self-pay

## 2021-09-22 ENCOUNTER — Other Ambulatory Visit: Payer: Self-pay

## 2021-09-22 ENCOUNTER — Ambulatory Visit: Payer: Medicaid Other

## 2021-09-22 NOTE — Lactation Note (Signed)
This note was copied from a baby's chart. Lactation Consultation Note  Patient Name: Shannon Trujillo [id number] Date: 09/22/2021 Reason for consult: Follow-up assessment;Late-preterm 34-36.6wks Age:24 hours   P3 mother whose infant is now 73 hours old.  This is a LPTI at 36+1 weeks with a CGA of 36+4 weeks.  Mother's current feeding preference is breast/formula.  This is mother's first time breast feeding.  Weight loss 6% today; up 1% from yesterday.  Mother had no questions/concerns related to breast feeding.  She will continue the plan of care we discussed yesterday.  Baby has been primarily formula feeding with Similac Neosure 22 calorie formula.  Volumes have been 15-30 mls.  Multiple voids/stools. Continue to put baby to the breast as tolerated with supplementation and pumping after breast feeding.  Mother has obtained a Stork pump from the hospital.  Parents have our OP phone number for any concerns after discharge.  Father present.   Maternal Data    Feeding Mother's Current Feeding Choice: Breast Milk and Formula Nipple Type: Slow - flow  LATCH Score                    Lactation Tools Discussed/Used    Interventions Interventions: Education  Discharge Discharge Education: Engorgement and breast care  Consult Status Consult Status: Complete Date: 09/22/21 Follow-up type: Call as needed    Anjuli Gemmill R Veronia Laprise 09/22/2021, 12:09 PM

## 2021-09-27 ENCOUNTER — Other Ambulatory Visit: Payer: Self-pay

## 2021-09-27 ENCOUNTER — Ambulatory Visit (INDEPENDENT_AMBULATORY_CARE_PROVIDER_SITE_OTHER): Payer: Medicaid Other

## 2021-09-27 VITALS — BP 118/89 | HR 91 | Wt 119.6 lb

## 2021-09-27 DIAGNOSIS — Z0130 Encounter for examination of blood pressure without abnormal findings: Secondary | ICD-10-CM

## 2021-09-27 MED ORDER — LABETALOL HCL 200 MG PO TABS: 200.0000 mg | ORAL_TABLET | Freq: Two times a day (BID) | ORAL | 3 refills | Status: DC

## 2021-09-27 NOTE — Progress Notes (Signed)
Blood Pressure Check Visit  Shannon Trujillo is here for blood pressure check following induced vaginal delivery on 09/19/21. BP today is 118/89. Patient denies any dizziness, blurred vision, headache, shortness of breath, peripheral edema. Patient states she stopped taking her prescribed Nifedipine on 09/25/21 due to it causing her headaches. Reviewed this with Dr. Dione Plover. Per Dr. Dione Plover patient to start taking Labetalol 200 mg twice daily up until 2 days prior to her postpartum visit on 11/01/21 where we will follow up with her and check her BP. Discussed this with patient and patient denies any other questions or concerns. Instructed patient to check her BP twice weekly and go to MAU with symptoms from elevated blood pressure. Prescription sent in to patient's pharmacy.   Seth Bake, RN 09/27/2021

## 2021-09-27 NOTE — Patient Instructions (Signed)
Please take labetalol as prescribed 200 mg twice daily. Stop taking medication 2 days prior to postpartum visit which is scheduled for 11/01/21, so take last dose of labetalol on 10/30/21. Please check blood pressure twice a week. We will recheck your blood pressure at your postpartum visit.

## 2021-10-03 ENCOUNTER — Telehealth (HOSPITAL_COMMUNITY): Payer: Self-pay

## 2021-10-03 NOTE — Telephone Encounter (Signed)
No answer. Left message to return nurse call.  Sharyn Lull [facility] 10/03/2021,1828

## 2021-10-09 ENCOUNTER — Ambulatory Visit: Payer: Medicaid Other | Admitting: Obstetrics and Gynecology

## 2021-10-09 ENCOUNTER — Encounter: Payer: Self-pay | Admitting: Obstetrics and Gynecology

## 2021-10-09 NOTE — Progress Notes (Signed)
Patient did not keep her BTL consult appointment for 10/09/2021.  Cornelia Copa MD Attending Center for [employer] Midwife)

## 2021-10-30 ENCOUNTER — Telehealth: Payer: Self-pay | Admitting: Family Medicine

## 2021-10-30 NOTE — Telephone Encounter (Signed)
Called patient to inform her of the appointment change, there was no answer to the phone call so a voicemail was left with the call back number for the office.

## 2021-11-01 ENCOUNTER — Ambulatory Visit: Payer: Medicaid Other | Admitting: Family Medicine

## 2021-11-02 ENCOUNTER — Ambulatory Visit: Payer: Medicaid Other | Admitting: Obstetrics and Gynecology

## 2022-08-19 ENCOUNTER — Other Ambulatory Visit: Payer: Self-pay

## 2022-08-19 ENCOUNTER — Emergency Department (HOSPITAL_COMMUNITY): Payer: Medicaid Other

## 2022-08-19 ENCOUNTER — Encounter (HOSPITAL_COMMUNITY): Payer: Self-pay

## 2022-08-19 ENCOUNTER — Emergency Department (HOSPITAL_COMMUNITY)
Admission: EM | Admit: 2022-08-19 | Discharge: 2022-08-19 | Disposition: A | Discharge status: Home / Self Care | Payer: Medicaid Other | Attending: Emergency Medicine | Admitting: Emergency Medicine

## 2022-08-19 DIAGNOSIS — N3001 Acute cystitis with hematuria: Secondary | ICD-10-CM | POA: Diagnosis not present

## 2022-08-19 DIAGNOSIS — R519 Headache, unspecified: Secondary | ICD-10-CM | POA: Diagnosis not present

## 2022-08-19 DIAGNOSIS — M545 Low back pain, unspecified: Secondary | ICD-10-CM | POA: Diagnosis present

## 2022-08-19 LAB — URINALYSIS, ROUTINE W REFLEX MICROSCOPIC
Bilirubin Urine: NEGATIVE
Glucose, UA: NEGATIVE mg/dL
Ketones, ur: 5 mg/dL — AB
Nitrite: NEGATIVE
Protein, ur: NEGATIVE mg/dL
Specific Gravity, Urine: 1.01 (ref 1.005–1.030)
pH: 7 (ref 5.0–8.0)

## 2022-08-19 LAB — CBC
HCT: 44 % (ref 36.0–46.0)
Hemoglobin: 14.4 g/dL (ref 12.0–15.0)
MCH: 26.6 pg (ref 26.0–34.0)
MCHC: 32.7 g/dL (ref 30.0–36.0)
MCV: 81.2 fL (ref 80.0–100.0)
Platelets: 402 10*3/uL — ABNORMAL HIGH (ref 150–400)
RBC: 5.42 MIL/uL — ABNORMAL HIGH (ref 3.87–5.11)
RDW: 14.2 % (ref 11.5–15.5)
WBC: 7.8 10*3/uL (ref 4.0–10.5)
nRBC: 0 % (ref 0.0–0.2)

## 2022-08-19 LAB — BASIC METABOLIC PANEL
Anion gap: 12 (ref 5–15)
BUN: 14 mg/dL (ref 6–20)
CO2: 22 mmol/L (ref 22–32)
Calcium: 9.5 mg/dL (ref 8.9–10.3)
Chloride: 106 mmol/L (ref 98–111)
Creatinine, Ser: 0.77 mg/dL (ref 0.44–1.00)
GFR, Estimated: >60 mL/min (ref >60)
Glucose, Bld: 114 mg/dL — ABNORMAL HIGH (ref 70–99)
Potassium: 3.6 mmol/L (ref 3.5–5.1)
Sodium: 140 mmol/L (ref 135–145)

## 2022-08-19 LAB — HEPATIC FUNCTION PANEL
ALT: 43 U/L (ref 0–44)
AST: 24 U/L (ref 15–41)
Albumin: 4.3 g/dL (ref 3.5–5.0)
Alkaline Phosphatase: 74 U/L (ref 38–126)
Bilirubin, Direct: 0.1 mg/dL (ref 0.0–0.2)
Indirect Bilirubin: 0.5 mg/dL (ref 0.3–0.9)
Total Bilirubin: 0.6 mg/dL (ref 0.3–1.2)
Total Protein: 7.9 g/dL (ref 6.5–8.1)

## 2022-08-19 LAB — PREGNANCY, URINE: Preg Test, Ur: NEGATIVE

## 2022-08-19 MED ORDER — OXYCODONE-ACETAMINOPHEN 5-325 MG PO TABS
ORAL_TABLET | ORAL | 0 refills | Status: DC
Start: 2022-08-19 — End: 2024-05-29

## 2022-08-19 MED ORDER — SODIUM CHLORIDE 0.9 % IV BOLUS
1000.0000 mL | Freq: Once | INTRAVENOUS | Status: AC
  Administered 2022-08-19: 1000 mL via INTRAVENOUS

## 2022-08-19 MED ORDER — METOCLOPRAMIDE HCL 5 MG/ML IJ SOLN
10.0000 mg | Freq: Once | INTRAMUSCULAR | Status: AC
  Administered 2022-08-19: 10 mg via INTRAVENOUS
  Filled 2022-08-19: qty 2

## 2022-08-19 MED ORDER — SULFAMETHOXAZOLE-TRIMETHOPRIM 800-160 MG PO TABS: 1.0000 | ORAL_TABLET | Freq: Two times a day (BID) | ORAL | 0 refills | Status: AC

## 2022-08-19 MED ORDER — DIPHENHYDRAMINE HCL 50 MG/ML IJ SOLN
25.0000 mg | Freq: Once | INTRAMUSCULAR | Status: AC
Start: 2022-08-19 — End: 2022-08-19
  Administered 2022-08-19: 25 mg via INTRAVENOUS
  Filled 2022-08-19: qty 1

## 2022-08-19 MED ORDER — HYDROMORPHONE HCL 1 MG/ML IJ SOLN
1.0000 mg | Freq: Once | INTRAMUSCULAR | Status: AC
  Administered 2022-08-19: 1 mg via INTRAVENOUS
  Filled 2022-08-19: qty 1

## 2022-08-19 MED ORDER — KETOROLAC TROMETHAMINE 30 MG/ML IJ SOLN
30.0000 mg | Freq: Once | INTRAMUSCULAR | Status: AC
  Administered 2022-08-19: 30 mg via INTRAVENOUS
  Filled 2022-08-19: qty 1

## 2022-08-19 NOTE — ED Notes (Signed)
ED Provider at bedside. 

## 2022-08-19 NOTE — ED Provider Triage Note (Addendum)
Emergency Medicine Provider Triage Evaluation Note  Shannon Trujillo , a 26 y.o. female  was evaluated in triage.  Pt complains of back pain and migraine. She states that her headache started 3 days ago and has been persistent since. States that she has had similar headaches like this recently but they normally go away on their own. Endorses photophobia. Also states that her back pain started this morning and her urine has been dark. Denies any injury, loss of bowel or bladder function or saddle paraesthesia.  Review of Systems  Positive:  Negative:   Physical Exam  BP 127/88 (BP Location: Left Arm)   Pulse 96   Temp 98.3 F (36.8 C) (Oral)   Resp 16   Ht 5\' 5"  (1.651 m)   Wt 56.7 kg   SpO2 99%   BMI 20.80 kg/m  Gen:   Awake, no distress   Resp:  Normal effort  MSK:   Moves extremities without difficulty  Other: Alert and oriented and neurologically intact without focal deficits  Medical Decision Making  Medically screening exam initiated at 4:57 PM.  Appropriate orders placed.  CANDE MASTROPIETRO was informed that the remainder of the evaluation will be completed by another provider, this initial triage assessment does not replace that evaluation, and the importance of remaining in the ED until their evaluation is complete.     [name], PA-C 08/19/22 1659    Rilda Bulls, 14/03/23, PA-C 08/19/22 1659

## 2022-08-19 NOTE — ED Provider Notes (Signed)
[place] DEPT Provider Note   CSN: [id number] Arrival date & time: 08/19/22  1539     History {Add pertinent medical, surgical, social history, OB history to HPI:1} Chief Complaint  Patient presents with   Migraine   Back Pain    Shannon Trujillo is a 26 y.o. female.  Patient complains of a headache.  She also complains of low back pain   Migraine  Back Pain      Home Medications Prior to Admission medications   Medication Sig Start Date End Date Taking? Authorizing Provider  oxyCODONE-acetaminophen (PERCOCET) 5-325 MG tablet Take 1 every 6 hours for pain not relieved by Tylenol or Motrin alone 08/19/22  Yes Milton Ferguson, MD  sulfamethoxazole-trimethoprim (BACTRIM DS) 800-160 MG tablet Take 1 tablet by mouth 2 (two) times daily for 7 days. 08/19/22 08/26/22 Yes Milton Ferguson, MD  acetaminophen (TYLENOL) 325 MG tablet Take 2 tablets (650 mg total) by mouth every 4 (four) hours as needed (for pain scale < 4). 09/21/21   Renard Matter, MD  Blood Pressure Monitoring (BLOOD PRESSURE KIT) DEVI 1 Device by Does not apply route daily. Patient not taking: Reported on 09/27/2021 09/06/21   Clarnce Flock, MD  ibuprofen (ADVIL) 600 MG tablet Take 1 tablet (600 mg total) by mouth every 6 (six) hours. 09/21/21   Renard Matter, MD  labetalol (NORMODYNE) 200 MG tablet Take 1 tablet (200 mg total) by mouth 2 (two) times daily. 09/27/21   Clarnce Flock, MD  NIFEdipine (ADALAT CC) 30 MG 24 hr tablet Take 1 tablet (30 mg total) by mouth daily. Patient not taking: Reported on 09/27/2021 09/21/21   Renard Matter, MD  Prenatal 27-1 MG TABS Take 1 tablet by mouth daily. 09/06/21   Clarnce Flock, MD      Allergies    Patient has no known allergies.    Review of Systems   Review of Systems  Musculoskeletal:  Positive for back pain.    Physical Exam Updated Vital Signs BP 108/70   Pulse 91   Temp 98.4 F (36.9 C)   Resp 16   Ht _0  (1.651 m)   Wt 56.7 kg    SpO2 99%   BMI 20.80 kg/m  Physical Exam  ED Results / Procedures / Treatments   Labs (all labs ordered are listed, but only abnormal results are displayed) Labs Reviewed  CBC - Abnormal; Notable for the following components:      Result Value   RBC 5.42 (*)    Platelets 402 (*)    All other components within normal limits  BASIC METABOLIC PANEL - Abnormal; Notable for the following components:   Glucose, Bld 114 (*)    All other components within normal limits  URINALYSIS, ROUTINE W REFLEX MICROSCOPIC - Abnormal; Notable for the following components:   APPearance HAZY (*)    Hgb urine dipstick SMALL (*)    Ketones, ur 5 (*)    Leukocytes,Ua MODERATE (*)    Bacteria, UA RARE (*)    All other components within normal limits  URINE CULTURE  HEPATIC FUNCTION PANEL  PREGNANCY, URINE    EKG None  Radiology CT Head Wo Contrast  Result Date: 08/19/2022 CLINICAL DATA:  Headaches x3 days EXAM: CT HEAD WITHOUT CONTRAST TECHNIQUE: Contiguous axial images were obtained from the base of the skull through the vertex without intravenous contrast. RADIATION DOSE REDUCTION: This exam was performed according to the departmental dose-optimization program which includes automated exposure  control, adjustment of the mA and/or kV according to patient size and/or use of iterative reconstruction technique. COMPARISON:  08/05/2021 FINDINGS: Brain: There are no signs of bleeding within the cranium. Ventricles are not dilated. There is no focal edema or mass effect. Cerebellar tonsils are slightly lower than usual in position in relation to foramen magnum. This may be a normal variation or suggest mild Chiari 1 malformation. This finding has not changed significantly. Vascular: Unremarkable. Skull: No fracture is seen in calvarium. Sinuses/Orbits: Unremarkable. Other: None. IMPRESSION: No acute intracranial findings are seen in noncontrast CT brain. No significant interval changes are noted since  08/05/2021. Electronically Signed   By: Elmer Picker M.D.   On: 08/19/2022 20:22    Procedures Procedures  {Document cardiac monitor, telemetry assessment procedure when appropriate:1}  Medications Ordered in ED Medications  sodium chloride 0.9 % bolus 1,000 mL (0 mLs Intravenous Stopped 08/19/22 2027)  ketorolac (TORADOL) 30 MG/ML injection 30 mg (30 mg Intravenous Given 08/19/22 1925)  metoCLOPramide (REGLAN) injection 10 mg (10 mg Intravenous Given 08/19/22 1926)  diphenhydrAMINE (BENADRYL) injection 25 mg (25 mg Intravenous Given 08/19/22 1924)  HYDROmorphone (DILAUDID) injection 1 mg (1 mg Intravenous Given 08/19/22 1957)    ED Course/ Medical Decision Making/ A&P                           Medical Decision Making Amount and/or Complexity of Data Reviewed Labs: ordered. Radiology: ordered.  Risk Prescription drug management.   Patient with a bad headache that is improved with pain medicine.  Urinalysis shows some infection cells.  She will get a urine culture and be started on Bactrim and will follow-up with the family doctor for headaches and possible UTI  {Document critical care time when appropriate:1} {Document review of labs and clinical decision tools ie heart score, Chads2Vasc2 etc:1}  {Document your independent review of radiology images, and any outside records:1} {Document your discussion with family members, caretakers, and with consultants:1} {Document social determinants of health affecting pt's care:1} {Document your decision making why or why not admission, treatments were needed:1} Final Clinical Impression(s) / ED Diagnoses Final diagnoses:  Bad headache  Acute cystitis with hematuria    Rx / DC Orders ED Discharge Orders          Ordered    sulfamethoxazole-trimethoprim (BACTRIM DS) 800-160 MG tablet  2 times daily        08/19/22 2040    oxyCODONE-acetaminophen (PERCOCET) 5-325 MG tablet        08/19/22 2040

## 2022-08-19 NOTE — Discharge Instructions (Addendum)
Follow-up within a week to recheck your back pain headache and urine.

## 2022-08-19 NOTE — ED Notes (Signed)
Patient transported to CT 

## 2022-08-19 NOTE — ED Triage Notes (Signed)
Per EMS- Patient c/o migraine x 3 days. Patient also reports lower back pain and states her urine is dark. Patient denies dysuria.

## 2022-08-21 LAB — URINE CULTURE: Culture: <10000 — AB

## 2022-11-13 ENCOUNTER — Emergency Department (HOSPITAL_COMMUNITY)
Admission: EM | Admit: 2022-11-13 | Discharge: 2022-11-14 | Disposition: A | Discharge status: Home / Self Care | Payer: Medicaid Other | Attending: Emergency Medicine | Admitting: Emergency Medicine

## 2022-11-13 ENCOUNTER — Emergency Department (HOSPITAL_COMMUNITY): Payer: Medicaid Other

## 2022-11-13 ENCOUNTER — Other Ambulatory Visit: Payer: Self-pay

## 2022-11-13 ENCOUNTER — Encounter (HOSPITAL_COMMUNITY): Payer: Self-pay

## 2022-11-13 DIAGNOSIS — R748 Abnormal levels of other serum enzymes: Secondary | ICD-10-CM | POA: Insufficient documentation

## 2022-11-13 DIAGNOSIS — Z1152 Encounter for screening for COVID-19: Secondary | ICD-10-CM | POA: Insufficient documentation

## 2022-11-13 DIAGNOSIS — R197 Diarrhea, unspecified: Secondary | ICD-10-CM | POA: Insufficient documentation

## 2022-11-13 DIAGNOSIS — R112 Nausea with vomiting, unspecified: Secondary | ICD-10-CM

## 2022-11-13 DIAGNOSIS — R0789 Other chest pain: Secondary | ICD-10-CM | POA: Insufficient documentation

## 2022-11-13 DIAGNOSIS — R000 Tachycardia, unspecified: Secondary | ICD-10-CM | POA: Insufficient documentation

## 2022-11-13 DIAGNOSIS — R0781 Pleurodynia: Secondary | ICD-10-CM | POA: Diagnosis not present

## 2022-11-13 DIAGNOSIS — R1013 Epigastric pain: Secondary | ICD-10-CM | POA: Insufficient documentation

## 2022-11-13 LAB — CBC WITH DIFFERENTIAL/PLATELET
Abs Immature Granulocytes: 0.02 10*3/uL (ref 0.00–0.07)
Basophils Absolute: 0 10*3/uL (ref 0.0–0.1)
Basophils Relative: 0 %
Eosinophils Absolute: 0 10*3/uL (ref 0.0–0.5)
Eosinophils Relative: 0 %
HCT: 37.8 % (ref 36.0–46.0)
Hemoglobin: 12.8 g/dL (ref 12.0–15.0)
Immature Granulocytes: 0 %
Lymphocytes Relative: 22 %
Lymphs Abs: 1.8 10*3/uL (ref 0.7–4.0)
MCH: 27.1 pg (ref 26.0–34.0)
MCHC: 33.9 g/dL (ref 30.0–36.0)
MCV: 79.9 fL — ABNORMAL LOW (ref 80.0–100.0)
Monocytes Absolute: 0.6 10*3/uL (ref 0.1–1.0)
Monocytes Relative: 7 %
Neutro Abs: 5.7 10*3/uL (ref 1.7–7.7)
Neutrophils Relative %: 71 %
Platelets: 279 10*3/uL (ref 150–400)
RBC: 4.73 MIL/uL (ref 3.87–5.11)
RDW: 13.2 % (ref 11.5–15.5)
WBC: 8.2 10*3/uL (ref 4.0–10.5)
nRBC: 0 % (ref 0.0–0.2)

## 2022-11-13 LAB — URINALYSIS, ROUTINE W REFLEX MICROSCOPIC
Bilirubin Urine: NEGATIVE
Glucose, UA: NEGATIVE mg/dL
Hgb urine dipstick: NEGATIVE
Ketones, ur: 20 mg/dL — AB
Leukocytes,Ua: NEGATIVE
Nitrite: NEGATIVE
Protein, ur: 30 mg/dL — AB
Specific Gravity, Urine: 1.024 (ref 1.005–1.030)
pH: 6 (ref 5.0–8.0)

## 2022-11-13 LAB — COMPREHENSIVE METABOLIC PANEL
ALT: 18 U/L (ref 0–44)
AST: 24 U/L (ref 15–41)
Albumin: 4 g/dL (ref 3.5–5.0)
Alkaline Phosphatase: 55 U/L (ref 38–126)
Anion gap: 7 (ref 5–15)
BUN: 14 mg/dL (ref 6–20)
CO2: 23 mmol/L (ref 22–32)
Calcium: 8.4 mg/dL — ABNORMAL LOW (ref 8.9–10.3)
Chloride: 108 mmol/L (ref 98–111)
Creatinine, Ser: 0.76 mg/dL (ref 0.44–1.00)
GFR, Estimated: >60 mL/min (ref >60)
Glucose, Bld: 97 mg/dL (ref 70–99)
Potassium: 3.6 mmol/L (ref 3.5–5.1)
Sodium: 138 mmol/L (ref 135–145)
Total Bilirubin: 1 mg/dL (ref 0.3–1.2)
Total Protein: 6.6 g/dL (ref 6.5–8.1)

## 2022-11-13 LAB — RESP PANEL BY RT-PCR (RSV, FLU A&B, COVID)  RVPGX2
Influenza A by PCR: NEGATIVE
Influenza B by PCR: NEGATIVE
Resp Syncytial Virus by PCR: NEGATIVE
SARS Coronavirus 2 by RT PCR: NEGATIVE

## 2022-11-13 LAB — I-STAT BETA HCG BLOOD, ED (MC, WL, AP ONLY): I-stat hCG, quantitative: <5 m[IU]/mL (ref <5)

## 2022-11-13 LAB — LIPASE, BLOOD: Lipase: 65 U/L — ABNORMAL HIGH (ref 11–51)

## 2022-11-13 MED ORDER — SODIUM CHLORIDE 0.9 % IV BOLUS
1000.0000 mL | Freq: Once | INTRAVENOUS | Status: AC
  Administered 2022-11-14: 1000 mL via INTRAVENOUS

## 2022-11-13 MED ORDER — FAMOTIDINE IN NACL 20-0.9 MG/50ML-% IV SOLN
20.0000 mg | Freq: Once | INTRAVENOUS | Status: AC
  Administered 2022-11-13: 20 mg via INTRAVENOUS
  Filled 2022-11-13: qty 50

## 2022-11-13 MED ORDER — HYDROMORPHONE HCL 1 MG/ML IJ SOLN
1.0000 mg | Freq: Once | INTRAMUSCULAR | Status: AC
  Administered 2022-11-14: 1 mg via INTRAVENOUS
  Filled 2022-11-13: qty 1

## 2022-11-13 MED ORDER — ALUM & MAG HYDROXIDE-SIMETH 200-200-20 MG/5ML PO SUSP
15.0000 mL | Freq: Once | ORAL | Status: AC
  Administered 2022-11-13: 15 mL via ORAL
  Filled 2022-11-13: qty 30

## 2022-11-13 MED ORDER — HYOSCYAMINE SULFATE 0.125 MG PO TABS: 0.1250 mg | ORAL_TABLET | Freq: Once | ORAL | Status: DC

## 2022-11-13 MED ORDER — HYOSCYAMINE SULFATE 0.125 MG SL SUBL
0.1250 mg | SUBLINGUAL_TABLET | Freq: Once | SUBLINGUAL | Status: AC
  Administered 2022-11-14: 0.125 mg via ORAL
  Filled 2022-11-13: qty 1

## 2022-11-13 NOTE — ED Provider Notes (Signed)
[facility] Provider Note   CSN: [id number] Arrival date & time: 11/13/22  2115     History  Chief Complaint  Patient presents with   Chest Pain   Fever    Shannon Trujillo is a 27 y.o. female presenting here with abdominal and chest pain.  Onset she reports upper chest pain earlier this evening about 30 minutes before she called EMS.  She reports epigastric pain that radiates up the center of her chest towards her throat.  Also reports this is pleuritic.  She says she has had nausea and poor appetite for about 2 days.  Also having loose bowel movements at home.  No sick contacts in the house.  He denies any other medical issues.  HPI     Home Medications Prior to Admission medications   Medication Sig Start Date End Date Taking? Authorizing Provider  acetaminophen (TYLENOL) 325 MG tablet Take 2 tablets (650 mg total) by mouth every 4 (four) hours as needed (for pain scale < 4). 09/21/21  Yes Renard Matter, MD  Blood Pressure Monitoring (BLOOD PRESSURE KIT) DEVI 1 Device by Does not apply route daily. Patient not taking: Reported on 09/27/2021 09/06/21   Clarnce Flock, MD  ibuprofen (ADVIL) 600 MG tablet Take 1 tablet (600 mg total) by mouth every 6 (six) hours. Patient not taking: Reported on 11/13/2022 09/21/21   Renard Matter, MD  labetalol (NORMODYNE) 200 MG tablet Take 1 tablet (200 mg total) by mouth 2 (two) times daily. Patient not taking: Reported on 11/13/2022 09/27/21   Clarnce Flock, MD  NIFEdipine (ADALAT CC) 30 MG 24 hr tablet Take 1 tablet (30 mg total) by mouth daily. Patient not taking: Reported on 09/27/2021 09/21/21   Renard Matter, MD  oxyCODONE-acetaminophen (PERCOCET) 5-325 MG tablet Take 1 every 6 hours for pain not relieved by Tylenol or Motrin alone Patient not taking: Reported on 11/13/2022 08/19/22   Milton Ferguson, MD  Prenatal 27-1 MG TABS Take 1 tablet by mouth daily. Patient not taking: Reported on 11/13/2022 09/06/21    Clarnce Flock, MD      Allergies    Patient has no known allergies.    Review of Systems   Review of Systems  Physical Exam Updated Vital Signs BP 118/76   Pulse 93   Temp 99.2 F (37.3 C) (Oral)   Resp (!) 24   Ht '5\' 4"'$  (1.626 m)   Wt 59 kg   SpO2 97%   BMI 22.31 kg/m  Physical Exam Constitutional:      General: She is not in acute distress. HENT:     Head: Normocephalic and atraumatic.  Eyes:     Conjunctiva/sclera: Conjunctivae normal.     Pupils: Pupils are equal, round, and reactive to light.  Cardiovascular:     Rate and Rhythm: Normal rate and regular rhythm.  Pulmonary:     Effort: Pulmonary effort is normal. No respiratory distress.  Abdominal:     General: There is no distension.     Tenderness: There is no abdominal tenderness.  Skin:    General: Skin is warm and dry.  Neurological:     General: No focal deficit present.     Mental Status: She is alert. Mental status is at baseline.  Psychiatric:        Mood and Affect: Mood normal.        Behavior: Behavior normal.     ED Results / Procedures /  Treatments   Labs (all labs ordered are listed, but only abnormal results are displayed) Labs Reviewed  COMPREHENSIVE METABOLIC PANEL - Abnormal; Notable for the following components:      Result Value   Calcium 8.4 (*)    All other components within normal limits  CBC WITH DIFFERENTIAL/PLATELET - Abnormal; Notable for the following components:   MCV 79.9 (*)    All other components within normal limits  URINALYSIS, ROUTINE W REFLEX MICROSCOPIC - Abnormal; Notable for the following components:   APPearance HAZY (*)    Ketones, ur 20 (*)    Protein, ur 30 (*)    Bacteria, UA RARE (*)    All other components within normal limits  LIPASE, BLOOD - Abnormal; Notable for the following components:   Lipase 65 (*)    All other components within normal limits  RESP PANEL BY RT-PCR (RSV, FLU A&B, COVID)  [id number]  I-STAT BETA HCG BLOOD, ED ([facility], [facility], [facility]  ONLY)    EKG EKG Interpretation  Date/Time:  Tuesday November 13 2022 21:28:07 EST Ventricular Rate:  96 PR Interval:  138 QRS Duration: 99 QT Interval:  375 QTC Calculation: 474 R Axis:   -2 Text Interpretation: Sinus rhythm RSR' in V1 or V2, right VCD or RVH Confirmed by Octaviano Glow [phone]) on 11/13/2022 9:47:43 PM  Radiology US Abdomen Limited RUQ (LIVER/GB)  Result Date: 11/13/2022 CLINICAL DATA:  Right upper quadrant pain x3 days. EXAM: ULTRASOUND ABDOMEN LIMITED RIGHT UPPER QUADRANT COMPARISON:  None Available. FINDINGS: Gallbladder: No gallstones or wall thickening visualized (1.2 mm). No sonographic Murphy sign noted by sonographer. Common bile duct: Diameter: 4.8 mm Liver: No focal lesion identified. Within normal limits in parenchymal echogenicity. Portal vein is patent on color Doppler imaging with normal direction of blood flow towards the liver. Other: None. IMPRESSION: Normal right upper quadrant ultrasound. Electronically Signed   By: Virgina Norfolk M.D.   On: 11/13/2022 23:39   DG Chest 2 View  Result Date: 11/13/2022 CLINICAL DATA:  Right-sided pleuritic chest pain. EXAM: CHEST - 2 VIEW COMPARISON:  None Available. FINDINGS: The heart size and mediastinal contours are within normal limits. Both lungs are clear. The visualized skeletal structures are unremarkable. IMPRESSION: No active cardiopulmonary disease. Electronically Signed   By: Virgina Norfolk M.D.   On: 11/13/2022 22:27    Procedures Procedures    Medications Ordered in ED Medications  HYDROmorphone (DILAUDID) injection 1 mg (has no administration in time range)  sodium chloride 0.9 % bolus 1,000 mL (has no administration in time range)  hyoscyamine (LEVSIN) tablet 0.125 mg (has no administration in time range)  famotidine (PEPCID) IVPB 20 mg premix (20 mg Intravenous New Bag/Given 11/13/22 2234)  alum & mag hydroxide-simeth (MAALOX/MYLANTA) 200-200-20 MG/5ML suspension 15 mL (15 mLs Oral Given  11/13/22 2235)    ED Course/ Medical Decision Making/ A&P                             Medical Decision Making Amount and/or Complexity of Data Reviewed Labs: ordered. Radiology: ordered.  Risk OTC drugs. Prescription drug management.   This patient presents to the Emergency Department with complaint of abdominal pain. This involves an extensive number of treatment options, and is a complaint that carries with it a high risk of complications and morbidity.  The differential diagnosis includes, but is not limited to, gastritis vs biliary disease vs peptic ulcer vs constipation vs colitis vs UTI vs other  This may also be lower chest pain and includes potential right lower lobe pneumonia or viral URI  I ordered, reviewed, and interpreted labs, notable for mildly elevated lipase, otherwise no emergent findings I ordered medication for abdominal pain and/or nausea I ordered imaging studies which included x-ray of the chest, CT abdomen pelvis I independently visualized and interpreted imaging which showed no acute intrathoracic findings, CT pending at signout and the monitor tracing which showed sinus rhythm  No improvement of pain after GI cocktail.  Patient signed out to Dr Leonette Monarch pending CT imaging to evaluate for pancreatitis vs colitis.  If stable can be discharged home on bentyl and zofran medications with likely viral gastroenteritis as cause of symptoms.         Final Clinical Impression(s) / ED Diagnoses Final diagnoses:  None    Rx / DC Orders ED Discharge Orders     None         Alahni Varone, Carola Rhine, MD 11/13/22 2352

## 2022-11-13 NOTE — ED Notes (Signed)
Pt transported to XR.  

## 2022-11-13 NOTE — ED Triage Notes (Signed)
Patient BIB EMS. Patient reports generalized chest pain starting today. Patient also reports SOB, fever, cough, nausea, and vomiting x 3 days. Patient took 324 mg Asprin at home. Patient denies cardiac hx.

## 2022-11-13 NOTE — ED Provider Notes (Signed)
I assumed care of this patient.  Please see previous provider note for further details of Hx, PE.  Briefly patient is a 27 y.o. female who presented with epigastric pain with N/V/D and low grade temp. Initial work up thus far is reassuring, currently pending CT abd/pelvis.   Plan to follow results and reassess.  2:47 AM CT with mild sigmoid wall thickening. No RLQ TTP. Likely bowel decompression rather than infection/inflammation.  Symptoms appear to be more consistent with viral gastroenteritis. Tolerated PO.  The patient appears reasonably screened and/or stabilized for discharge and I doubt any other medical condition or other [facility] requiring further screening, evaluation, or treatment in the ED at this time. I have discussed the findings, Dx and Tx plan with the patient/family who expressed understanding and agree(s) with the plan. Discharge instructions discussed at length. The patient/family was given strict return precautions who verbalized understanding of the instructions. No further questions at time of discharge.  Disposition: Discharge  Condition: Good  ED Discharge Orders          Ordered    Hyoscyamine Sulfate SL (LEVSIN/SL) 0.125 MG SUBL  4 times daily PRN        11/14/22 0245    ondansetron (ZOFRAN-ODT) 4 MG disintegrating tablet  Every 8 hours PRN        11/14/22 0245             Follow Up: Primary care provider  Call  to schedule an appointment for close follow up         Fatima Blank, MD 11/14/22 [phone]

## 2022-11-13 NOTE — ED Notes (Signed)
Pt ambulatory to restroom

## 2022-11-14 ENCOUNTER — Emergency Department (HOSPITAL_COMMUNITY): Payer: Medicaid Other

## 2022-11-14 ENCOUNTER — Encounter (HOSPITAL_COMMUNITY): Payer: Self-pay

## 2022-11-14 ENCOUNTER — Other Ambulatory Visit: Payer: Self-pay

## 2022-11-14 ENCOUNTER — Emergency Department (HOSPITAL_COMMUNITY)
Admission: EM | Admit: 2022-11-14 | Discharge: 2022-11-14 | Disposition: A | Discharge status: Home / Self Care | Payer: Medicaid Other | Source: Home / Self Care | Attending: Emergency Medicine | Admitting: Emergency Medicine

## 2022-11-14 DIAGNOSIS — R0789 Other chest pain: Secondary | ICD-10-CM | POA: Insufficient documentation

## 2022-11-14 DIAGNOSIS — R000 Tachycardia, unspecified: Secondary | ICD-10-CM | POA: Insufficient documentation

## 2022-11-14 DIAGNOSIS — R112 Nausea with vomiting, unspecified: Secondary | ICD-10-CM | POA: Insufficient documentation

## 2022-11-14 DIAGNOSIS — R197 Diarrhea, unspecified: Secondary | ICD-10-CM | POA: Insufficient documentation

## 2022-11-14 LAB — D-DIMER, QUANTITATIVE: D-Dimer, Quant: 0.31 ug/mL-FEU (ref 0.00–0.50)

## 2022-11-14 LAB — TROPONIN I (HIGH SENSITIVITY)
Troponin I (High Sensitivity): <2 ng/L (ref <18)
Troponin I (High Sensitivity): <2 ng/L (ref <18)

## 2022-11-14 MED ORDER — IOHEXOL 300 MG/ML  SOLN
100.0000 mL | Freq: Once | INTRAMUSCULAR | Status: AC | PRN
  Administered 2022-11-14: 100 mL via INTRAVENOUS

## 2022-11-14 MED ORDER — IBUPROFEN 600 MG PO TABS: 600.0000 mg | ORAL_TABLET | Freq: Four times a day (QID) | ORAL | 0 refills | Status: DC | PRN

## 2022-11-14 MED ORDER — LIDOCAINE 5 % EX PTCH: 1.0000 | MEDICATED_PATCH | CUTANEOUS | 0 refills | Status: DC

## 2022-11-14 MED ORDER — ACETAMINOPHEN 500 MG PO TABS: 500.0000 mg | ORAL_TABLET | Freq: Four times a day (QID) | ORAL | 0 refills | Status: DC | PRN

## 2022-11-14 MED ORDER — ONDANSETRON 4 MG PO TBDP: 4.0000 mg | ORAL_TABLET | Freq: Three times a day (TID) | ORAL | 0 refills | Status: AC | PRN

## 2022-11-14 MED ORDER — METHOCARBAMOL 500 MG PO TABS: 1000.0000 mg | ORAL_TABLET | Freq: Every evening | ORAL | 0 refills | Status: AC

## 2022-11-14 MED ORDER — SODIUM CHLORIDE (PF) 0.9 % IJ SOLN
INTRAMUSCULAR | Status: AC
  Filled 2022-11-14: qty 50

## 2022-11-14 MED ORDER — HYOSCYAMINE SULFATE SL 0.125 MG SL SUBL: 1.0000 | SUBLINGUAL_TABLET | Freq: Four times a day (QID) | SUBLINGUAL | 0 refills | Status: DC | PRN

## 2022-11-14 MED ORDER — MORPHINE SULFATE (PF) 4 MG/ML IV SOLN
4.0000 mg | Freq: Once | INTRAVENOUS | Status: AC
  Administered 2022-11-14: 4 mg via INTRAVENOUS
  Filled 2022-11-14: qty 1

## 2022-11-14 NOTE — ED Provider Triage Note (Signed)
Emergency Medicine Provider Triage Evaluation Note  Shannon Trujillo , a 27 y.o. female  was evaluated in triage.  Patient seen yesterday.  Endorsing pain from her right chest that radiates around to the front.  Reports that the pain is worse than yesterday.  Right upper quadrant ultrasound, CTAP, chest x-ray and abdominal pain labs all negative yesterday.  No history of DVT/PE, recent travel or surgery, OCPs, leg swelling or tobacco use.  Reports that she vapes nicotine.  Review of Systems  Positive:  Negative:   Physical Exam  BP (!) 108/92   Pulse (!) 112   Temp 98.3 F (36.8 C) (Oral)   Resp 18   Ht '5\' 4"'$  (1.626 m)   Wt 59 kg   LMP  (LMP Unknown)   SpO2 100%   BMI 22.31 kg/m  Gen:   Awake, no distress   Resp:  Normal effort  MSK:   Moves extremities without difficulty  Other:  Reproducible anterior chest wall pain, patient appears uncomfortable  Medical Decision Making  Medically screening exam initiated at 1:46 PM.  Appropriate orders placed.  Shannon Trujillo was informed that the remainder of the evaluation will be completed by another provider, this initial triage assessment does not replace that evaluation, and the importance of remaining in the ED until their evaluation is complete.   No risk factors for PE.  Mildly tachycardic, appears to be secondary to pain however chest pain labs were not ordered yesterday.  Will add on a troponin and do a repeat EKG.  PE workup deferred to the back of the department   Shannon Hammock, PA-C 11/14/22 1348

## 2022-11-14 NOTE — ED Triage Notes (Signed)
Patient is here for evaluation of chest pain. Reports the chest pain starts on the left side and runs across her chest. Reports being seen yesterday for the same, but today the pain is worse. Also reports nausea, vomiting and diarrhea X 3 days.

## 2022-11-14 NOTE — Discharge Instructions (Addendum)
You came to the emergency department again for your chest/abdominal pain.  All of your stuff looked normal yesterday and the remainder of the labs we did today are also normal.  I believe that your pain is due to your muscles in your bones.  I would like you to try ibuprofen, Tylenol, lidocaine patches and muscle relaxants.  Ultimately, please follow-up with a PCP.  It was a pleasure to meet you and we hope you feel better!

## 2022-11-14 NOTE — ED Provider Notes (Signed)
[facility] Provider Note   CSN: [id number] Arrival date & time: 11/14/22  1323     History  Chief Complaint  Patient presents with   Chest Pain    Shannon Trujillo is a 27 y.o. female presenting today with concern of right-sided chest pain.  She was seen for this last night but reports that its only getting worse.  Denies any palpitations or shortness of breath.  Says that it mostly is in her right lower rib cage but radiates around the front to her left shoulder.   Chest Pain      Home Medications Prior to Admission medications   Medication Sig Start Date End Date Taking? Authorizing Provider  acetaminophen (TYLENOL) 325 MG tablet Take 2 tablets (650 mg total) by mouth every 4 (four) hours as needed (for pain scale < 4). 09/21/21   Renard Matter, MD  Blood Pressure Monitoring (BLOOD PRESSURE KIT) DEVI 1 Device by Does not apply route daily. Patient not taking: Reported on 09/27/2021 09/06/21   Clarnce Flock, MD  Hyoscyamine Sulfate SL (LEVSIN/SL) 0.125 MG SUBL Place 1 tablet (0.125 mg total) under the tongue 4 (four) times daily as needed for up to 5 days. 11/14/22 11/19/22  Fatima Blank, MD  ibuprofen (ADVIL) 600 MG tablet Take 1 tablet (600 mg total) by mouth every 6 (six) hours. Patient not taking: Reported on 11/13/2022 09/21/21   Renard Matter, MD  labetalol (NORMODYNE) 200 MG tablet Take 1 tablet (200 mg total) by mouth 2 (two) times daily. Patient not taking: Reported on 11/13/2022 09/27/21   Clarnce Flock, MD  NIFEdipine (ADALAT CC) 30 MG 24 hr tablet Take 1 tablet (30 mg total) by mouth daily. Patient not taking: Reported on 09/27/2021 09/21/21   Renard Matter, MD  ondansetron (ZOFRAN-ODT) 4 MG disintegrating tablet Take 1 tablet (4 mg total) by mouth every 8 (eight) hours as needed for up to 3 days for nausea or vomiting. 11/14/22 11/17/22  Fatima Blank, MD  oxyCODONE-acetaminophen (PERCOCET) 5-325 MG tablet Take 1 every  6 hours for pain not relieved by Tylenol or Motrin alone Patient not taking: Reported on 11/13/2022 08/19/22   Milton Ferguson, MD  Prenatal 27-1 MG TABS Take 1 tablet by mouth daily. Patient not taking: Reported on 11/13/2022 09/06/21   Clarnce Flock, MD      Allergies    Patient has no known allergies.    Review of Systems   Review of Systems  Cardiovascular:  Positive for chest pain.    Physical Exam Updated Vital Signs BP (!) 108/92   Pulse (!) 112   Temp 98.3 F (36.8 C) (Oral)   Resp 18   Ht '5\' 4"'$  (1.626 m)   Wt 59 kg   LMP  (LMP Unknown)   SpO2 100%   BMI 22.31 kg/m  Physical Exam Vitals and nursing note reviewed.  Constitutional:      General: She is not in acute distress.    Appearance: Normal appearance.  HENT:     Head: Normocephalic and atraumatic.  Eyes:     General: No scleral icterus.    Conjunctiva/sclera: Conjunctivae normal.  Cardiovascular:     Rate and Rhythm: Regular rhythm. Tachycardia present.  Pulmonary:     Effort: Pulmonary effort is normal. No respiratory distress.  Chest:     Chest wall: Tenderness (Reproducible chest tenderness over the anterior right lower rib cage.  Appears to radiate over her sternum  and to left shoulder) present.  Abdominal:     Palpations: Abdomen is soft.  Musculoskeletal:     Right lower leg: No edema.     Left lower leg: No edema.  Skin:    Findings: No rash.  Neurological:     Mental Status: She is alert.  Psychiatric:        Mood and Affect: Mood normal.     ED Results / Procedures / Treatments   Labs (all labs ordered are listed, but only abnormal results are displayed) Labs Reviewed  D-DIMER, QUANTITATIVE  TROPONIN I (HIGH SENSITIVITY)  TROPONIN I (HIGH SENSITIVITY)    EKG None  Radiology DG Chest 2 View  Result Date: 11/14/2022 CLINICAL DATA:  Pain right chest EXAM: CHEST - 2 VIEW COMPARISON:  11/13/2022 x-ray FINDINGS: No consolidation, pneumothorax or effusion. No edema. Normal  cardiopericardial silhouette. Overlapping cardiac leads. Slight pectus excavatum. IMPRESSION: No acute cardiopulmonary disease Electronically Signed   By: Jill Side M.D.   On: 11/14/2022 14:11   CT ABDOMEN PELVIS W CONTRAST  Result Date: 11/14/2022 CLINICAL DATA:  Fever with nausea and vomiting. EXAM: CT ABDOMEN AND PELVIS WITH CONTRAST TECHNIQUE: Multidetector CT imaging of the abdomen and pelvis was performed using the standard protocol following bolus administration of intravenous contrast. RADIATION DOSE REDUCTION: This exam was performed according to the departmental dose-optimization program which includes automated exposure control, adjustment of the mA and/or kV according to patient size and/or use of iterative reconstruction technique. CONTRAST:  120m OMNIPAQUE IOHEXOL 300 MG/ML  SOLN COMPARISON:  None Available. FINDINGS: Lower chest: No acute abnormality. Hepatobiliary: No focal liver abnormality is seen. No gallstones, gallbladder wall thickening, or biliary dilatation. Pancreas: Unremarkable. No pancreatic ductal dilatation or surrounding inflammatory changes. Spleen: Normal in size without focal abnormality. Adrenals/Urinary Tract: Adrenal glands are unremarkable. Kidneys are normal, without renal calculi, focal lesion, or hydronephrosis. Bladder is unremarkable. Stomach/Bowel: Stomach is within normal limits. Appendix appears normal. No evidence of bowel dilatation. Mild to moderate severity thickening of the mid to distal sigmoid colon is noted (axial CT images 59 through 74, CT series 2). Vascular/Lymphatic: No significant vascular findings are present. No enlarged abdominal or pelvic lymph nodes. Reproductive: Uterus and bilateral adnexa are unremarkable. Other: No abdominal wall hernia or abnormality. No abdominopelvic ascites. Musculoskeletal: No acute or significant osseous findings. IMPRESSION: Thickened mid to distal sigmoid colon which may be, in part, related to poor bowel  distention. Sequelae associated with mild colitis cannot be excluded. Electronically Signed   By: TVirgina NorfolkM.D.   On: 11/14/2022 01:18   [place]Abdomen Limited RUQ (LIVER/GB)  Result Date: 11/13/2022 CLINICAL DATA:  Right upper quadrant pain x3 days. EXAM: ULTRASOUND ABDOMEN LIMITED RIGHT UPPER QUADRANT COMPARISON:  None Available. FINDINGS: Gallbladder: No gallstones or wall thickening visualized (1.2 mm). No sonographic Murphy sign noted by sonographer. Common bile duct: Diameter: 4.8 mm Liver: No focal lesion identified. Within normal limits in parenchymal echogenicity. Portal vein is patent on color Doppler imaging with normal direction of blood flow towards the liver. Other: None. IMPRESSION: Normal right upper quadrant ultrasound. Electronically Signed   By: TVirgina NorfolkM.D.   On: 11/13/2022 23:39   DG Chest 2 View  Result Date: 11/13/2022 CLINICAL DATA:  Right-sided pleuritic chest pain. EXAM: CHEST - 2 VIEW COMPARISON:  None Available. FINDINGS: The heart size and mediastinal contours are within normal limits. Both lungs are clear. The visualized skeletal structures are unremarkable. IMPRESSION: No active cardiopulmonary disease. Electronically Signed  By: Virgina Norfolk M.D.   On: 11/13/2022 22:27    Procedures Procedures   Medications Ordered in ED Medications  morphine (PF) 4 MG/ML injection 4 mg (4 mg Intravenous Given 11/14/22 1630)    ED Course/ Medical Decision Making/ A&P                             Medical Decision Making Amount and/or Complexity of Data Reviewed Labs: ordered. Radiology: ordered.  Risk OTC drugs. Prescription drug management.   27 year old female presenting with chest pain.  Seen yesterday. The emergent differential diagnosis of chest pain includes: Acute coronary syndrome, pericarditis, aortic dissection, pulmonary embolism, tension pneumothorax, and esophageal rupture.  This is not an exhaustive differential.    Additional  history: I viewed patient's visit yesterday.  She had an extensive workup to include right upper quadrant ultrasound, CT abdomen pelvis, chest x-ray and abdominal labs.   Physical Exam: Pertinent physical exam findings include Reproducible pain Lab Tests: I ordered, and personally interpreted labs.  The pertinent results include: Troponin negative x 2 Dimer negative   Imaging Studies: I ordered and independently visualized and interpreted the x-ray and I agree with the radiologist that that there are no abnormalities   Cardiac Monitoring:  The patient was maintained on a cardiac monitor.  I viewed and interpreted the cardiac monitored which showed an underlying rhythm of: Sinus   Medications: I ordered medication including morphine and   MDM/Disposition: This is a 27 year old female who presented today with chest pain that is reproducible on physical exam.  She was seen for the same overnight.  Had negative abdominal pain labs, right upper quadrant ultrasound, CTAP and cxr.  Troponins were not drawn yesterday so I added these on.  These were negative.  Dimer added and this was also negative.  At this point patient has had a negative ACS and acute abdomen workup within the last 24 hours.  Pain is reproducible.  Believe it is most likely chest wall pain.  Will give NSAIDs, Tylenol, lidocaine patches and muscle relaxants.  She is agreeable to the plan.  Given various PCP offices for evaluation.  Discharged.   Final Clinical Impression(s) / ED Diagnoses Final diagnoses:  Chest wall pain    Rx / DC Orders ED Discharge Orders          Ordered    lidocaine (LIDODERM) 5 %  Every 24 hours        11/14/22 1719    ibuprofen (ADVIL) 600 MG tablet  Every 6 hours PRN        11/14/22 1719    acetaminophen (TYLENOL) 500 MG tablet  Every 6 hours PRN        11/14/22 1719    methocarbamol (ROBAXIN) 500 MG tablet  Nightly        11/14/22 1719           Results and diagnoses were explained  to the patient. Return precautions discussed in full. Patient had no additional questions and expressed complete understanding.   This chart was dictated using voice recognition software.  Despite best efforts to proofread,  errors can occur which can change the documentation meaning.     Darliss Ridgel 11/14/22 1746    Tegeler, Gwenyth Allegra, MD 11/15/22 [id number]

## 2022-11-15 ENCOUNTER — Emergency Department (HOSPITAL_BASED_OUTPATIENT_CLINIC_OR_DEPARTMENT_OTHER)
Admission: EM | Admit: 2022-11-15 | Discharge: 2022-11-15 | Disposition: A | Discharge status: Home / Self Care | Payer: Medicaid Other | Attending: Emergency Medicine | Admitting: Emergency Medicine

## 2022-11-15 ENCOUNTER — Encounter (HOSPITAL_BASED_OUTPATIENT_CLINIC_OR_DEPARTMENT_OTHER): Payer: Self-pay | Admitting: Emergency Medicine

## 2022-11-15 ENCOUNTER — Emergency Department (HOSPITAL_BASED_OUTPATIENT_CLINIC_OR_DEPARTMENT_OTHER): Payer: Medicaid Other

## 2022-11-15 DIAGNOSIS — R112 Nausea with vomiting, unspecified: Secondary | ICD-10-CM | POA: Diagnosis not present

## 2022-11-15 DIAGNOSIS — R197 Diarrhea, unspecified: Secondary | ICD-10-CM | POA: Insufficient documentation

## 2022-11-15 DIAGNOSIS — R1084 Generalized abdominal pain: Secondary | ICD-10-CM | POA: Insufficient documentation

## 2022-11-15 DIAGNOSIS — R1031 Right lower quadrant pain: Secondary | ICD-10-CM | POA: Insufficient documentation

## 2022-11-15 LAB — CBC WITH DIFFERENTIAL/PLATELET
Abs Immature Granulocytes: 0.01 K/uL (ref 0.00–0.07)
Basophils Absolute: 0 K/uL (ref 0.0–0.1)
Basophils Relative: 1 %
Eosinophils Absolute: 0 K/uL (ref 0.0–0.5)
Eosinophils Relative: 0 %
HCT: 40.3 % (ref 36.0–46.0)
Hemoglobin: 13.6 g/dL (ref 12.0–15.0)
Immature Granulocytes: 0 %
Lymphocytes Relative: 23 %
Lymphs Abs: 1.6 K/uL (ref 0.7–4.0)
MCH: 27.1 pg (ref 26.0–34.0)
MCHC: 33.7 g/dL (ref 30.0–36.0)
MCV: 80.4 fL (ref 80.0–100.0)
Monocytes Absolute: 0.6 K/uL (ref 0.1–1.0)
Monocytes Relative: 8 %
Neutro Abs: 4.7 K/uL (ref 1.7–7.7)
Neutrophils Relative %: 68 %
Platelets: 271 K/uL (ref 150–400)
RBC: 5.01 MIL/uL (ref 3.87–5.11)
RDW: 13.5 % (ref 11.5–15.5)
WBC: 6.9 K/uL (ref 4.0–10.5)
nRBC: 0 % (ref 0.0–0.2)

## 2022-11-15 LAB — COMPREHENSIVE METABOLIC PANEL
ALT: 14 U/L (ref 0–44)
AST: 21 U/L (ref 15–41)
Albumin: 4.5 g/dL (ref 3.5–5.0)
Alkaline Phosphatase: 67 U/L (ref 38–126)
Anion gap: 7 (ref 5–15)
BUN: 17 mg/dL (ref 6–20)
CO2: 23 mmol/L (ref 22–32)
Calcium: 9.1 mg/dL (ref 8.9–10.3)
Chloride: 105 mmol/L (ref 98–111)
Creatinine, Ser: 0.82 mg/dL (ref 0.44–1.00)
GFR, Estimated: >60 mL/min (ref >60)
Glucose, Bld: 77 mg/dL (ref 70–99)
Potassium: 3.5 mmol/L (ref 3.5–5.1)
Sodium: 135 mmol/L (ref 135–145)
Total Bilirubin: 0.4 mg/dL (ref 0.3–1.2)
Total Protein: 7.9 g/dL (ref 6.5–8.1)

## 2022-11-15 LAB — HCG, QUANTITATIVE, PREGNANCY: hCG, Beta Chain, Quant, S: <1 m[IU]/mL (ref <5)

## 2022-11-15 MED ORDER — KETOROLAC TROMETHAMINE 15 MG/ML IJ SOLN
15.0000 mg | Freq: Once | INTRAMUSCULAR | Status: AC
  Administered 2022-11-15: 15 mg via INTRAVENOUS
  Filled 2022-11-15: qty 1

## 2022-11-15 MED ORDER — SODIUM CHLORIDE 0.9 % IV BOLUS
1000.0000 mL | Freq: Once | INTRAVENOUS | Status: AC
  Administered 2022-11-15: 1000 mL via INTRAVENOUS

## 2022-11-15 MED ORDER — ONDANSETRON HCL 4 MG PO TABS: 4.0000 mg | ORAL_TABLET | Freq: Four times a day (QID) | ORAL | 0 refills | Status: DC

## 2022-11-15 MED ORDER — LOPERAMIDE HCL 2 MG PO CAPS: 2.0000 mg | ORAL_CAPSULE | Freq: Four times a day (QID) | ORAL | 0 refills | Status: DC | PRN

## 2022-11-15 MED ORDER — IOHEXOL 300 MG/ML  SOLN
100.0000 mL | Freq: Once | INTRAMUSCULAR | Status: AC | PRN
  Administered 2022-11-15: 100 mL via INTRAVENOUS

## 2022-11-15 MED ORDER — ONDANSETRON HCL 4 MG/2ML IJ SOLN
4.0000 mg | Freq: Once | INTRAMUSCULAR | Status: AC
  Administered 2022-11-15: 4 mg via INTRAVENOUS
  Filled 2022-11-15: qty 2

## 2022-11-15 NOTE — ED Provider Notes (Addendum)
[facility] Provider Note   CSN: [id number] Arrival date & time: 11/15/22  1152     History  Chief Complaint  Patient presents with   Abdominal Pain    Shannon Trujillo is a 27 y.o. female, no pertinent past medical history, who presents to the ED secondary to right lower quadrant pain since 2 AM, that radiates to her bellybutton.  She states that it is achy in nature, and sharp.  Endorses nausea and vomiting, 3 episodes of vomiting this morning.  States that she was previously having chest pain, nausea vomiting, diarrhea for the last couple days, went to the ER yesterday, and was told that her imaging looked good.  States that the right lower quadrant pain started this a.m., so that is why she came in.  Denies any vaginal discharge, urinary symptoms. Home Medications Prior to Admission medications   Medication Sig Start Date End Date Taking? Authorizing Provider  loperamide (IMODIUM) 2 MG capsule Take 1 capsule (2 mg total) by mouth 4 (four) times daily as needed for diarrhea or loose stools. 11/15/22  Yes Aahana Elza L, PA  ondansetron (ZOFRAN) 4 MG tablet Take 1 tablet (4 mg total) by mouth every 6 (six) hours. 11/15/22  Yes Kimberlly Norgard L, PA  acetaminophen (TYLENOL) 325 MG tablet Take 2 tablets (650 mg total) by mouth every 4 (four) hours as needed (for pain scale < 4). 09/21/21   Renard Matter, MD  acetaminophen (TYLENOL) 500 MG tablet Take 1 tablet (500 mg total) by mouth every 6 (six) hours as needed. 11/14/22   Redwine, Madison A, PA-C  Blood Pressure Monitoring (BLOOD PRESSURE KIT) DEVI 1 Device by Does not apply route daily. Patient not taking: Reported on 09/27/2021 09/06/21   Clarnce Flock, MD  Hyoscyamine Sulfate SL (LEVSIN/SL) 0.125 MG SUBL Place 1 tablet (0.125 mg total) under the tongue 4 (four) times daily as needed for up to 5 days. 11/14/22 11/19/22  Fatima Blank, MD  ibuprofen (ADVIL) 600 MG tablet Take 1 tablet (600 mg  total) by mouth every 6 (six) hours. Patient not taking: Reported on 11/13/2022 09/21/21   Renard Matter, MD  ibuprofen (ADVIL) 600 MG tablet Take 1 tablet (600 mg total) by mouth every 6 (six) hours as needed. 11/14/22   Redwine, Madison A, PA-C  labetalol (NORMODYNE) 200 MG tablet Take 1 tablet (200 mg total) by mouth 2 (two) times daily. Patient not taking: Reported on 11/13/2022 09/27/21   Clarnce Flock, MD  lidocaine (LIDODERM) 5 % Place 1 patch onto the skin daily. Remove & Discard patch within 12 hours or as directed by MD 11/14/22   Redwine, Madison A, PA-C  methocarbamol (ROBAXIN) 500 MG tablet Take 2 tablets (1,000 mg total) by mouth at bedtime for 7 days. 11/14/22 11/21/22  Redwine, Madison A, PA-C  NIFEdipine (ADALAT CC) 30 MG 24 hr tablet Take 1 tablet (30 mg total) by mouth daily. Patient not taking: Reported on 09/27/2021 09/21/21   Renard Matter, MD  ondansetron (ZOFRAN-ODT) 4 MG disintegrating tablet Take 1 tablet (4 mg total) by mouth every 8 (eight) hours as needed for up to 3 days for nausea or vomiting. 11/14/22 11/17/22  Fatima Blank, MD  oxyCODONE-acetaminophen (PERCOCET) 5-325 MG tablet Take 1 every 6 hours for pain not relieved by Tylenol or Motrin alone Patient not taking: Reported on 11/13/2022 08/19/22   Milton Ferguson, MD  Prenatal 27-1 MG TABS Take 1 tablet by mouth  daily. Patient not taking: Reported on 11/13/2022 09/06/21   Clarnce Flock, MD      Allergies    Patient has no known allergies.    Review of Systems   Review of Systems  Gastrointestinal:  Positive for abdominal pain, nausea and vomiting.    Physical Exam Updated Vital Signs BP 104/82   Pulse 85   Temp 98.6 F (37 C) (Oral)   Resp 18   Ht '5\' 4"'$  (1.626 m)   Wt 59 kg   LMP  (LMP Unknown) Comment: hcg <1 in er today  SpO2 100%   BMI 22.31 kg/m  Physical Exam Vitals and nursing note reviewed.  Constitutional:      General: She is not in acute distress.    Appearance: She is well-developed.   HENT:     Head: Normocephalic and atraumatic.  Eyes:     Conjunctiva/sclera: Conjunctivae normal.  Cardiovascular:     Rate and Rhythm: Normal rate and regular rhythm.     Heart sounds: No murmur heard. Pulmonary:     Effort: Pulmonary effort is normal. No respiratory distress.     Breath sounds: Normal breath sounds.  Abdominal:     Palpations: Abdomen is soft.     Tenderness: There is generalized abdominal tenderness. There is no guarding or rebound.  Musculoskeletal:        General: No swelling.     Cervical back: Neck supple.  Skin:    General: Skin is warm and dry.     Capillary Refill: Capillary refill takes less than 2 seconds.  Neurological:     Mental Status: She is alert.  Psychiatric:        Mood and Affect: Mood normal.     ED Results / Procedures / Treatments   Labs (all labs ordered are listed, but only abnormal results are displayed) Labs Reviewed  COMPREHENSIVE METABOLIC PANEL  CBC WITH DIFFERENTIAL/PLATELET  HCG, QUANTITATIVE, PREGNANCY  URINALYSIS, ROUTINE W REFLEX MICROSCOPIC  RAPID URINE DRUG SCREEN, HOSP PERFORMED    EKG None  Radiology CT ABDOMEN PELVIS W CONTRAST  Result Date: 11/15/2022 CLINICAL DATA:  Right lower quadrant pain, nausea, vomiting, and diarrhea. EXAM: CT ABDOMEN AND PELVIS WITH CONTRAST TECHNIQUE: Multidetector CT imaging of the abdomen and pelvis was performed using the standard protocol following bolus administration of intravenous contrast. RADIATION DOSE REDUCTION: This exam was performed according to the departmental dose-optimization program which includes automated exposure control, adjustment of the mA and/or kV according to patient size and/or use of iterative reconstruction technique. CONTRAST:  155m OMNIPAQUE IOHEXOL 300 MG/ML  SOLN COMPARISON:  CT abdomen and pelvis 11/14/2022 FINDINGS: Lower chest: Clear lung bases. Hepatobiliary: No focal liver abnormality is seen. No gallstones, gallbladder wall thickening, or biliary  dilatation. Pancreas: Unremarkable. Spleen: Unremarkable. Adrenals/Urinary Tract: Unremarkable adrenal glands. No evidence of a renal mass, calculi, or hydronephrosis. Unremarkable bladder. Stomach/Bowel: The stomach is unremarkable. There is no evidence of bowel obstruction or inflammation. The appendix is unremarkable. Vascular/Lymphatic: Normal caliber of the abdominal aorta. No enlarged lymph nodes. Reproductive: Uterus and bilateral adnexa are unremarkable. Other: No ascites or pneumoperitoneum. Musculoskeletal: No acute osseous abnormality or suspicious osseous lesion. IMPRESSION: No acute abnormality identified in the abdomen or pelvis. Electronically Signed   By: ALogan BoresM.D.   On: 11/15/2022 14:59   DG Chest 2 View  Result Date: 11/14/2022 CLINICAL DATA:  Pain right chest EXAM: CHEST - 2 VIEW COMPARISON:  11/13/2022 x-ray FINDINGS: No consolidation, pneumothorax or effusion. No  edema. Normal cardiopericardial silhouette. Overlapping cardiac leads. Slight pectus excavatum. IMPRESSION: No acute cardiopulmonary disease Electronically Signed   By: Jill Side M.D.   On: 11/14/2022 14:11   CT ABDOMEN PELVIS W CONTRAST  Result Date: 11/14/2022 CLINICAL DATA:  Fever with nausea and vomiting. EXAM: CT ABDOMEN AND PELVIS WITH CONTRAST TECHNIQUE: Multidetector CT imaging of the abdomen and pelvis was performed using the standard protocol following bolus administration of intravenous contrast. RADIATION DOSE REDUCTION: This exam was performed according to the departmental dose-optimization program which includes automated exposure control, adjustment of the mA and/or kV according to patient size and/or use of iterative reconstruction technique. CONTRAST:  173m OMNIPAQUE IOHEXOL 300 MG/ML  SOLN COMPARISON:  None Available. FINDINGS: Lower chest: No acute abnormality. Hepatobiliary: No focal liver abnormality is seen. No gallstones, gallbladder wall thickening, or biliary dilatation. Pancreas:  Unremarkable. No pancreatic ductal dilatation or surrounding inflammatory changes. Spleen: Normal in size without focal abnormality. Adrenals/Urinary Tract: Adrenal glands are unremarkable. Kidneys are normal, without renal calculi, focal lesion, or hydronephrosis. Bladder is unremarkable. Stomach/Bowel: Stomach is within normal limits. Appendix appears normal. No evidence of bowel dilatation. Mild to moderate severity thickening of the mid to distal sigmoid colon is noted (axial CT images 59 through 74, CT series 2). Vascular/Lymphatic: No significant vascular findings are present. No enlarged abdominal or pelvic lymph nodes. Reproductive: Uterus and bilateral adnexa are unremarkable. Other: No abdominal wall hernia or abnormality. No abdominopelvic ascites. Musculoskeletal: No acute or significant osseous findings. IMPRESSION: Thickened mid to distal sigmoid colon which may be, in part, related to poor bowel distention. Sequelae associated with mild colitis cannot be excluded. Electronically Signed   By: TVirgina NorfolkM.D.   On: 11/14/2022 01:18   [place]Abdomen Limited RUQ (LIVER/GB)  Result Date: 11/13/2022 CLINICAL DATA:  Right upper quadrant pain x3 days. EXAM: ULTRASOUND ABDOMEN LIMITED RIGHT UPPER QUADRANT COMPARISON:  None Available. FINDINGS: Gallbladder: No gallstones or wall thickening visualized (1.2 mm). No sonographic Murphy sign noted by sonographer. Common bile duct: Diameter: 4.8 mm Liver: No focal lesion identified. Within normal limits in parenchymal echogenicity. Portal vein is patent on color Doppler imaging with normal direction of blood flow towards the liver. Other: None. IMPRESSION: Normal right upper quadrant ultrasound. Electronically Signed   By: TVirgina NorfolkM.D.   On: 11/13/2022 23:39   DG Chest 2 View  Result Date: 11/13/2022 CLINICAL DATA:  Right-sided pleuritic chest pain. EXAM: CHEST - 2 VIEW COMPARISON:  None Available. FINDINGS: The heart size and mediastinal  contours are within normal limits. Both lungs are clear. The visualized skeletal structures are unremarkable. IMPRESSION: No active cardiopulmonary disease. Electronically Signed   By: TVirgina NorfolkM.D.   On: 11/13/2022 22:27    Procedures Procedures    Medications Ordered in ED Medications  sodium chloride 0.9 % bolus 1,000 mL (1,000 mLs Intravenous New Bag/Given 11/15/22 1250)  ondansetron (ZOFRAN) injection 4 mg (4 mg Intravenous Given 11/15/22 1246)  ketorolac (TORADOL) 15 MG/ML injection 15 mg (15 mg Intravenous Given 11/15/22 1246)  iohexol (OMNIPAQUE) 300 MG/ML solution 100 mL (100 mLs Intravenous Contrast Given 11/15/22 1433)    ED Course/ Medical Decision Making/ A&P             HEART Score: 0                Medical Decision Making Patient is a 27year old female, here for nausea, vomiting, diarrhea, right upper quadrant pain that now radiated right lower quadrant pain, has been  to the ER 3 times in the last 48 hours.  D-dimer was -2 days ago, for right upper quadrant pain, has had a number of negative right upper quadrant ultrasound, and now presents for right lower quadrant pain she does have tenderness to palpation throughout, but no guarding or rebound.  She did just have a CT abdomen pelvis, but she states that this pain is worse, and denies any kind of urinary symptoms, concern for STDs vaginal discharge.  We will obtain a second CT abdomen pelvis, to evaluate for possible progression into appendicitis, as patient may have been early on in her original presentation.  Amount and/or Complexity of Data Reviewed Labs: ordered.    Details: Labs unremarkable Radiology: ordered.    Details: CT abdomen pelvis, unremarkable Discussion of management or test interpretation with external provider(s): Discussed with patient, negative CT abdomen pelvis, has recently had a negative right upper quadrant ultrasound, she has no pelvic pain, vaginal discharge, urinary symptoms, and urinalysis  2 days ago was unremarkable.  She is feeling better upon discharge able to tolerate p.o. intake.  We will discharge her home with Toradol, Zofran, and Imodium.  She has no red flag symptoms for diarrhea that is concerning for infectious diarrhea, this is possibly viral, caused by colitis, as seen on the original CT abdomen/pelvis.  Her symptoms are likely related to a GI bug.  We discussed return precautions, she voiced understanding.  Discharged home.  Risk Prescription drug management.    Final Clinical Impression(s) / ED Diagnoses Final diagnoses:  RLQ abdominal pain  Nausea vomiting and diarrhea    Rx / DC Orders ED Discharge Orders          Ordered    ondansetron (ZOFRAN) 4 MG tablet  Every 6 hours        11/15/22 1513    loperamide (IMODIUM) 2 MG capsule  4 times daily PRN        11/15/22 1513              Shalamar Crays Carlean Jews, PA 11/15/22 1513    Sherita Decoste Carlean Jews, PA 11/15/22 Hiller, [place], DO 11/16/22 [phone]

## 2022-11-15 NOTE — Discharge Instructions (Addendum)
Please follow-up with your primary care doctor if you do not have a primary care doctor please look at the list, and find information to establish care with a primary care doctor.  If you have severe nausea, vomiting, that is intractable, and worsening abdominal pain please return to the ER.

## 2022-11-15 NOTE — ED Notes (Signed)
Discharge paperwork reviewed entirely with patient, including Rx's and follow up care. Pain was under control. Pt verbalized understanding as well as all parties involved. No questions or concerns voiced at the time of discharge. No acute distress noted.   Pt ambulated out to PVA without incident or assistance.  

## 2022-11-15 NOTE — ED Triage Notes (Signed)
RLQ abdominal pain since 2 am this morning. Describes pain constant, sharp, 10/10. Reports pain radiates to umbilical region. Pain has progressively worsened throughout the day. Also endorses n/v/d. Denies urinary sx, vaginal bleeding/discharge.

## 2023-04-30 ENCOUNTER — Ambulatory Visit
Admission: EM | Admit: 2023-04-30 | Discharge: 2023-04-30 | Disposition: A | Discharge status: Home / Self Care | Payer: Medicaid Other | Attending: Family Medicine | Admitting: Family Medicine

## 2023-04-30 DIAGNOSIS — L255 Unspecified contact dermatitis due to plants, except food: Secondary | ICD-10-CM | POA: Diagnosis not present

## 2023-04-30 MED ORDER — DEXAMETHASONE SODIUM PHOSPHATE 10 MG/ML IJ SOLN
10.0000 mg | Freq: Once | INTRAMUSCULAR | Status: AC
  Administered 2023-04-30: 10 mg via INTRAMUSCULAR

## 2023-04-30 MED ORDER — TRIAMCINOLONE ACETONIDE 0.1 % EX CREA: 1.0000 | TOPICAL_CREAM | Freq: Two times a day (BID) | CUTANEOUS | 0 refills | Status: DC | PRN

## 2023-04-30 MED ORDER — PREDNISONE 20 MG PO TABS: ORAL_TABLET | ORAL | 0 refills | Status: AC

## 2023-04-30 NOTE — ED Triage Notes (Signed)
Patient to Urgent Care with complaints of rash present to face/ chest/ arms/ necks. Symptoms started Saturday.   Possible exposure to poison ivy. No known allergies. No recent new products or foods.

## 2023-04-30 NOTE — ED Provider Notes (Signed)
[facility]    CSN: [id number] Arrival date & time: 04/30/23  1144      History   Chief Complaint Chief Complaint  Patient presents with   Rash    HPI Shannon Trujillo is a 27 y.o. female.   HPI Patient here with rash on the face, arms, and neck x 4 days after contact with poison ivy plant. The rash has expanded to eye lids and entire face. She endorses severe itching and noticed her face and eyelids are now swollen. Denies any difficult breathing or chest tightness associated with rash.   Past Medical History:  Diagnosis Date   Anemia in pregnancy 02/18/2018   Cholestasis of pregnancy    Postpartum hemorrhage 12/03/2015    Patient Active Problem List   Diagnosis Date Noted   Postpartum hypertension 09/20/2021   Cholestasis during pregnancy in third trimester 09/07/2021   Supervision of high risk pregnancy, antepartum 09/06/2021   History of pregnancy induced hypertension 09/06/2021   SVD (spontaneous vaginal delivery) 03/24/2018    Past Surgical History:  Procedure Laterality Date   NO PAST SURGERIES      OB History     Gravida  3   Para  3   Term  2   Preterm  1   AB  0   Living  3      SAB  0   IAB  0   Ectopic  0   Multiple  0   Live Births  3            Home Medications    Prior to Admission medications   Medication Sig Start Date End Date Taking? Authorizing Provider  predniSONE (DELTASONE) 20 MG tablet Take 3 tablets (60 mg total) by mouth daily with breakfast for 3 days, THEN 2 tablets (40 mg total) daily with breakfast for 3 days, THEN 1 tablet (20 mg total) daily with breakfast for 1 day, THEN 0.5 tablets (10 mg total) daily with breakfast for 2 days. 04/30/23 05/09/23 Yes Bing Neighbors, NP  triamcinolone cream (KENALOG) 0.1 % Apply 1 Application topically 2 (two) times daily as needed (rash). 04/30/23  Yes Bing Neighbors, NP  acetaminophen (TYLENOL) 325 MG tablet Take 2 tablets (650 mg total) by mouth every 4  (four) hours as needed (for pain scale < 4). 09/21/21   Warner Mccreedy, MD  acetaminophen (TYLENOL) 500 MG tablet Take 1 tablet (500 mg total) by mouth every 6 (six) hours as needed. 11/14/22   Redwine, Madison A, PA-C  Blood Pressure Monitoring (BLOOD PRESSURE KIT) DEVI 1 Device by Does not apply route daily. Patient not taking: Reported on 09/27/2021 09/06/21   Venora Maples, MD  Hyoscyamine Sulfate SL (LEVSIN/SL) 0.125 MG SUBL Place 1 tablet (0.125 mg total) under the tongue 4 (four) times daily as needed for up to 5 days. 11/14/22 11/19/22  Nira Conn, MD  ibuprofen (ADVIL) 600 MG tablet Take 1 tablet (600 mg total) by mouth every 6 (six) hours. Patient not taking: Reported on 11/13/2022 09/21/21   Warner Mccreedy, MD  ibuprofen (ADVIL) 600 MG tablet Take 1 tablet (600 mg total) by mouth every 6 (six) hours as needed. 11/14/22   Redwine, Madison A, PA-C  labetalol (NORMODYNE) 200 MG tablet Take 1 tablet (200 mg total) by mouth 2 (two) times daily. Patient not taking: Reported on 11/13/2022 09/27/21   Venora Maples, MD  lidocaine (LIDODERM) 5 % Place 1 patch onto the skin daily.  Remove & Discard patch within 12 hours or as directed by MD 11/14/22   Redwine, Madison A, PA-C  loperamide (IMODIUM) 2 MG capsule Take 1 capsule (2 mg total) by mouth 4 (four) times daily as needed for diarrhea or loose stools. 11/15/22   Small, Brooke L, PA  NIFEdipine (ADALAT CC) 30 MG 24 hr tablet Take 1 tablet (30 mg total) by mouth daily. Patient not taking: Reported on 09/27/2021 09/21/21   Warner Mccreedy, MD  ondansetron (ZOFRAN) 4 MG tablet Take 1 tablet (4 mg total) by mouth every 6 (six) hours. 11/15/22   Small, Brooke L, PA  oxyCODONE-acetaminophen (PERCOCET) 5-325 MG tablet Take 1 every 6 hours for pain not relieved by Tylenol or Motrin alone Patient not taking: Reported on 11/13/2022 08/19/22   Bethann Berkshire, MD  Prenatal 27-1 MG TABS Take 1 tablet by mouth daily. Patient not taking: Reported on 11/13/2022 09/06/21    Venora Maples, MD    Family History Family History  Problem Relation Age of Onset   Diabetes Paternal Grandmother    Anemia Mother     Social History Social History   Tobacco Use   Smoking status: Some Days    Types: Cigarettes   Smokeless tobacco: Former  Building services engineer status: Every Day   Substances: Nicotine, Flavoring  Substance Use Topics   Alcohol use: No   Drug use: No     Allergies   Patient has no known allergies.   Review of Systems Review of Systems Pertinent negatives listed in HPI   Physical Exam Triage Vital Signs ED Triage Vitals  Encounter Vitals Group     BP 04/30/23 1224 113/79     Systolic BP Percentile --      Diastolic BP Percentile --      Pulse Rate 04/30/23 1224 68     Resp 04/30/23 1224 18     Temp 04/30/23 1224 98.3 F (36.8 C)     Temp src --      SpO2 04/30/23 1224 98 %     Weight --      Height --      Head Circumference --      Peak Flow --      Pain Score 04/30/23 1221 0     Pain Loc --      Pain Education --      Exclude from Growth Chart --    No data found.  Updated Vital Signs BP 113/79   Pulse 68   Temp 98.3 F (36.8 C)   Resp 18   LMP 04/01/2023   SpO2 98%   Visual Acuity Right Eye Distance:   Left Eye Distance:   Bilateral Distance:    Right Eye Near:   Left Eye Near:    Bilateral Near:     Physical Exam Vitals reviewed.  Constitutional:      Appearance: Normal appearance.  Cardiovascular:     Rate and Rhythm: Normal rate and regular rhythm.  Pulmonary:     Effort: Pulmonary effort is normal.     Breath sounds: Normal breath sounds.  Musculoskeletal:     Cervical back: Normal range of motion.  Skin:    General: Skin is warm.     Findings: Erythema and rash present. Rash is macular and papular.     Comments: Fine papular rash diffusely spread on face, bilateral arms and upper chest   Neurological:     General: No focal deficit present.  Mental Status: She is alert.       UC Treatments / Results  Labs (all labs ordered are listed, but only abnormal results are displayed) Labs Reviewed - No data to display  EKG   Radiology No results found.  Procedures Procedures (including critical care time)  Medications Ordered in UC Medications  dexamethasone (DECADRON) injection 10 mg (10 mg Intramuscular Given 04/30/23 1305)    Initial Impression / Assessment and Plan / UC Course  I have reviewed the triage vital signs and the nursing notes.  Pertinent labs & imaging results that were available during my care of the patient were reviewed by me and considered in my medical decision making (see chart for details).    Dermatitis, Decadron 10 mg IM given in clinic today. Start prednisone taper tomorrow.  Triamcinolone cream to be applied affected areas except eyes up to twice daily as needed. Ok to take benadryl or any other antihistamine or acute itching.Return as needed. Final Clinical Impressions(s) / UC Diagnoses   Final diagnoses:  Dermatitis due to plants, including poison ivy, sumac, and oak     Discharge Instructions      Start oral steroid tomorrow morning and take as directed over the next 9 days.  Topical steroid cream applications up to twice daily as needed to rash and avoid eyes and or eyelid.     ED Prescriptions     Medication Sig Dispense Auth. Provider   predniSONE (DELTASONE) 20 MG tablet Take 3 tablets (60 mg total) by mouth daily with breakfast for 3 days, THEN 2 tablets (40 mg total) daily with breakfast for 3 days, THEN 1 tablet (20 mg total) daily with breakfast for 1 day, THEN 0.5 tablets (10 mg total) daily with breakfast for 2 days. 17 tablet Bing Neighbors, NP   triamcinolone cream (KENALOG) 0.1 % Apply 1 Application topically 2 (two) times daily as needed (rash). 60 g Bing Neighbors, NP      PDMP not reviewed this encounter.   Bing Neighbors, NP 04/30/23 [id number]

## 2023-04-30 NOTE — Discharge Instructions (Signed)
Start oral steroid tomorrow morning and take as directed over the next 9 days.  Topical steroid cream applications up to twice daily as needed to rash and avoid eyes and or eyelid.

## 2023-08-09 ENCOUNTER — Other Ambulatory Visit: Payer: Self-pay

## 2023-09-18 NOTE — L&D Delivery Note (Signed)
         Delivery Note   Shannon Trujillo is a 28 y.o. [phone] at Unknown Estimated Date of Delivery: No prenatal care  PRE-OPERATIVE DIAGNOSIS:  1)  pregnancy.    POST-OPERATIVE DIAGNOSIS:  1)  pregnancy s/p Vaginal, Spontaneous   Delivery Type: Vaginal, Spontaneous , at home   Delivery Anesthesia: None  Labor Complications:  none known    ESTIMATED BLOOD LOSS: 100 ml , EMS reported pt had multiple saturated pads in transport. On arrival 100 ml clots expressed x 1.    FINDINGS:   1) female infant, Apgar scores unknown , infant delivered at home.  birthweight of 114.29 ounces.    2) Nuchal cord: none reported  SPECIMENS:   PLACENTA:   Appearance: Intact 3 vessel cord   Removal: Spontaneous     Disposition:  to pathology  DISPOSITION:  Infant to left in stable condition in the delivery room, with L&D personnel and mother,  NARRATIVE SUMMARY: Labor course:  [name] is a [id number] at Unknown who presented for delivery at home.  She delivered  a viable female infant at home . The placenta delivered without problems and was noted to be complete on examination. A perineal and vaginal examination was performed. Episiotomy/Lacerations: clitoral , hemostatic no repair needed. The patient tolerated this well.  Zelda Hummer, CNM  05/27/2024 3:34 PM

## 2024-05-27 ENCOUNTER — Inpatient Hospital Stay
Admission: EM | Admit: 2024-05-27 | Discharge: 2024-05-29 | DRG: 776 | Disposition: A | Discharge status: Home / Self Care | Attending: Obstetrics and Gynecology | Admitting: Obstetrics and Gynecology

## 2024-05-27 ENCOUNTER — Other Ambulatory Visit: Payer: Self-pay

## 2024-05-27 DIAGNOSIS — Z3A00 Weeks of gestation of pregnancy not specified: Secondary | ICD-10-CM

## 2024-05-27 DIAGNOSIS — O0990 Supervision of high risk pregnancy, unspecified, unspecified trimester: Secondary | ICD-10-CM

## 2024-05-27 DIAGNOSIS — O9081 Anemia of the puerperium: Secondary | ICD-10-CM | POA: Diagnosis present

## 2024-05-27 DIAGNOSIS — F1490 Cocaine use, unspecified, uncomplicated: Secondary | ICD-10-CM | POA: Diagnosis present

## 2024-05-27 DIAGNOSIS — D62 Acute posthemorrhagic anemia: Secondary | ICD-10-CM | POA: Diagnosis present

## 2024-05-27 DIAGNOSIS — O99324 Drug use complicating childbirth: Secondary | ICD-10-CM | POA: Diagnosis present

## 2024-05-27 DIAGNOSIS — O165 Unspecified maternal hypertension, complicating the puerperium: Principal | ICD-10-CM

## 2024-05-27 LAB — RAPID HIV SCREEN (HIV 1/2 AB+AG)
HIV 1/2 Antibodies: NONREACTIVE
HIV-1 P24 Antigen - HIV24: NONREACTIVE

## 2024-05-27 LAB — CBC
HCT: 24.5 % — ABNORMAL LOW (ref 36.0–46.0)
Hemoglobin: 8 g/dL — ABNORMAL LOW (ref 12.0–15.0)
MCH: 25.7 pg — ABNORMAL LOW (ref 26.0–34.0)
MCHC: 32.7 g/dL (ref 30.0–36.0)
MCV: 78.8 fL — ABNORMAL LOW (ref 80.0–100.0)
Platelets: 182 K/uL (ref 150–400)
RBC: 3.11 MIL/uL — ABNORMAL LOW (ref 3.87–5.11)
RDW: 15.9 % — ABNORMAL HIGH (ref 11.5–15.5)
WBC: 16.4 K/uL — ABNORMAL HIGH (ref 4.0–10.5)
nRBC: 0 % (ref 0.0–0.2)

## 2024-05-27 LAB — HEPATITIS B SURFACE ANTIGEN: Hepatitis B Surface Ag: NONREACTIVE

## 2024-05-27 MED ORDER — SIMETHICONE 80 MG PO CHEW: 80.0000 mg | CHEWABLE_TABLET | ORAL | Status: DC | PRN

## 2024-05-27 MED ORDER — SODIUM CHLORIDE 0.9 % IV BOLUS: 500.0000 mL | Freq: Once | INTRAVENOUS | Status: DC | PRN

## 2024-05-27 MED ORDER — IRON SUCROSE 500 MG IVPB - SIMPLE MED
500.0000 mg | Freq: Once | INTRAVENOUS | Status: AC
  Administered 2024-05-27: 500 mg via INTRAVENOUS
  Filled 2024-05-27: qty 500

## 2024-05-27 MED ORDER — WITCH HAZEL-GLYCERIN EX PADS: 1.0000 | MEDICATED_PAD | CUTANEOUS | Status: DC | PRN

## 2024-05-27 MED ORDER — DIPHENHYDRAMINE HCL 50 MG/ML IJ SOLN: 25.0000 mg | Freq: Once | INTRAMUSCULAR | Status: DC | PRN

## 2024-05-27 MED ORDER — TRANEXAMIC ACID-NACL 1000-0.7 MG/100ML-% IV SOLN
1000.0000 mg | INTRAVENOUS | Status: AC
  Administered 2024-05-27: 1000 mg via INTRAVENOUS

## 2024-05-27 MED ORDER — EPINEPHRINE 0.3 MG/0.3ML IJ SOAJ: 0.3000 mg | Freq: Once | INTRAMUSCULAR | Status: DC | PRN

## 2024-05-27 MED ORDER — TETANUS-DIPHTH-ACELL PERTUSSIS 5-2.5-18.5 LF-MCG/0.5 IM SUSY: 0.5000 mL | PREFILLED_SYRINGE | Freq: Once | INTRAMUSCULAR | Status: AC

## 2024-05-27 MED ORDER — OXYTOCIN-SODIUM CHLORIDE 30-0.9 UT/500ML-% IV SOLN: 2.5000 [IU]/h | INTRAVENOUS | Status: DC

## 2024-05-27 MED ORDER — METHYLERGONOVINE MALEATE 0.2 MG/ML IJ SOLN: 0.2000 mg | INTRAMUSCULAR | Status: DC | PRN

## 2024-05-27 MED ORDER — ONDANSETRON HCL 4 MG/2ML IJ SOLN: 4.0000 mg | Freq: Four times a day (QID) | INTRAMUSCULAR | Status: DC | PRN

## 2024-05-27 MED ORDER — BENZOCAINE-MENTHOL 20-0.5 % EX AERO: 1.0000 | INHALATION_SPRAY | CUTANEOUS | Status: DC | PRN

## 2024-05-27 MED ORDER — ALBUTEROL SULFATE (2.5 MG/3ML) 0.083% IN NEBU: 2.5000 mg | INHALATION_SOLUTION | Freq: Once | RESPIRATORY_TRACT | Status: DC | PRN

## 2024-05-27 MED ORDER — COCONUT OIL OIL: 1.0000 | TOPICAL_OIL | Status: DC | PRN

## 2024-05-27 MED ORDER — OXYCODONE HCL 5 MG PO TABS: 5.0000 mg | ORAL_TABLET | ORAL | Status: DC | PRN

## 2024-05-27 MED ORDER — DIBUCAINE (PERIANAL) 1 % EX OINT: 1.0000 | TOPICAL_OINTMENT | CUTANEOUS | Status: DC | PRN

## 2024-05-27 MED ORDER — IBUPROFEN 600 MG PO TABS
600.0000 mg | ORAL_TABLET | Freq: Four times a day (QID) | ORAL | Status: DC
  Administered 2024-05-27 – 2024-05-29 (×8): 600 mg via ORAL
  Filled 2024-05-27 (×8): qty 1

## 2024-05-27 MED ORDER — DOCUSATE SODIUM 100 MG PO CAPS
100.0000 mg | ORAL_CAPSULE | Freq: Two times a day (BID) | ORAL | Status: DC
  Administered 2024-05-28 – 2024-05-29 (×3): 100 mg via ORAL
  Filled 2024-05-27 (×3): qty 1

## 2024-05-27 MED ORDER — ONDANSETRON HCL 4 MG PO TABS: 4.0000 mg | ORAL_TABLET | ORAL | Status: DC | PRN

## 2024-05-27 MED ORDER — OXYCODONE HCL 5 MG PO TABS: 10.0000 mg | ORAL_TABLET | ORAL | Status: DC | PRN

## 2024-05-27 MED ORDER — PRENATAL MULTIVITAMIN CH
1.0000 | ORAL_TABLET | Freq: Every day | ORAL | Status: DC
  Administered 2024-05-28 – 2024-05-29 (×2): 1 via ORAL
  Filled 2024-05-27 (×2): qty 1

## 2024-05-27 MED ORDER — METHYLERGONOVINE MALEATE 0.2 MG PO TABS: 0.2000 mg | ORAL_TABLET | ORAL | Status: DC | PRN

## 2024-05-27 MED ORDER — SENNOSIDES-DOCUSATE SODIUM 8.6-50 MG PO TABS
2.0000 | ORAL_TABLET | ORAL | Status: DC
  Administered 2024-05-27 – 2024-05-28 (×2): 2 via ORAL
  Filled 2024-05-27 (×2): qty 2

## 2024-05-27 MED ORDER — FERROUS SULFATE 325 (65 FE) MG PO TABS
325.0000 mg | ORAL_TABLET | Freq: Every day | ORAL | Status: DC
  Administered 2024-05-28 – 2024-05-29 (×2): 325 mg via ORAL
  Filled 2024-05-27 (×2): qty 1

## 2024-05-27 MED ORDER — SODIUM CHLORIDE 0.9 % IV SOLN: INTRAVENOUS | Status: AC | PRN

## 2024-05-27 MED ORDER — LACTATED RINGERS IV SOLN: 500.0000 mL | INTRAVENOUS | Status: DC | PRN

## 2024-05-27 MED ORDER — LIDOCAINE HCL (PF) 1 % IJ SOLN: 30.0000 mL | INTRAMUSCULAR | Status: DC | PRN

## 2024-05-27 MED ORDER — ONDANSETRON HCL 4 MG/2ML IJ SOLN: 4.0000 mg | INTRAMUSCULAR | Status: DC | PRN

## 2024-05-27 MED ORDER — METHYLPREDNISOLONE SODIUM SUCC 125 MG IJ SOLR: 125.0000 mg | Freq: Once | INTRAMUSCULAR | Status: DC | PRN

## 2024-05-27 MED ORDER — OXYTOCIN BOLUS FROM INFUSION: 333.0000 mL | Freq: Once | INTRAVENOUS | Status: DC

## 2024-05-27 MED ORDER — LACTATED RINGERS IV SOLN: INTRAVENOUS | Status: DC

## 2024-05-27 MED ORDER — ACETAMINOPHEN 325 MG PO TABS
650.0000 mg | ORAL_TABLET | ORAL | Status: DC | PRN
  Administered 2024-05-28: 650 mg via ORAL

## 2024-05-27 NOTE — OB Triage Note (Addendum)
 Patient is a [id number] at unknown gestational age for an unexpected home vaginal delivery @ 1229 with delivery of placenta in EMS ambulance. EMS were not able to weigh blood loss but estimates between 1-2 liters of blood loss. Patient admits to knowing she was pregnant but states I was in denial. Patient did not receive any prenatal care and has stated interest in adoption. Patient experienced a gush of vaginal bleeding with placenta. Upon arrival, CNM assessed a clitoral laceration that was no longer needed. Fundus firm, U/1, bleeding scant.

## 2024-05-27 NOTE — Progress Notes (Signed)
 Subjective:  Pt bleeding is stable. She has not yet gotten out of bed . She denies feeling dizzy.   Objective: CBC    Component Value Date/Time   WBC 16.4 (H) 05/27/2024 1441   RBC 3.11 (L) 05/27/2024 1441   HGB 8.0 (L) 05/27/2024 1441   HGB 10.5 (L) 09/06/2021 1053   HCT 24.5 (L) 05/27/2024 1441   HCT 31.5 (L) 09/06/2021 1053   PLT 182 05/27/2024 1441   PLT 186 09/06/2021 1053   MCV 78.8 (L) 05/27/2024 1441   MCV 81 09/06/2021 1053   MCH 25.7 (L) 05/27/2024 1441   MCHC 32.7 05/27/2024 1441   RDW 15.9 (H) 05/27/2024 1441   RDW 12.9 09/06/2021 1053   LYMPHSABS 1.6 11/15/2022 1251   LYMPHSABS 1.4 10/25/2017 1151   MONOABS 0.6 11/15/2022 1251   EOSABS 0.0 11/15/2022 1251   EOSABS 0.0 10/25/2017 1151   BASOSABS 0.0 11/15/2022 1251   BASOSABS 0.0 10/25/2017 1151    Assessment:  Anemia possible acute blood loss with report from EMS of multiple saturated pads.   Plan: Iron  infusion. Will continue to monitor for symptoms . Repeat CBC in am.   Zelda Hummer, CNM

## 2024-05-28 LAB — URINE DRUG SCREEN, QUALITATIVE (ARMC ONLY)
Amphetamines, Ur Screen: NOT DETECTED
Barbiturates, Ur Screen: NOT DETECTED
Benzodiazepine, Ur Scrn: NOT DETECTED
Cannabinoid 50 Ng, Ur ~~LOC~~: NOT DETECTED
Cocaine Metabolite,Ur ~~LOC~~: POSITIVE — AB
MDMA (Ecstasy)Ur Screen: NOT DETECTED
Methadone Scn, Ur: NOT DETECTED
Opiate, Ur Screen: NOT DETECTED
Phencyclidine (PCP) Ur S: NOT DETECTED
Tricyclic, Ur Screen: NOT DETECTED

## 2024-05-28 LAB — RUBELLA SCREEN: Rubella: 1.95 {index} (ref >0.99)

## 2024-05-28 LAB — CBC
HCT: 20.5 % — ABNORMAL LOW (ref 36.0–46.0)
Hemoglobin: 6.8 g/dL — ABNORMAL LOW (ref 12.0–15.0)
MCH: 26 pg (ref 26.0–34.0)
MCHC: 33.2 g/dL (ref 30.0–36.0)
MCV: 78.2 fL — ABNORMAL LOW (ref 80.0–100.0)
Platelets: 172 K/uL (ref 150–400)
RBC: 2.62 MIL/uL — ABNORMAL LOW (ref 3.87–5.11)
RDW: 16 % — ABNORMAL HIGH (ref 11.5–15.5)
WBC: 12.7 K/uL — ABNORMAL HIGH (ref 4.0–10.5)
nRBC: 0 % (ref 0.0–0.2)

## 2024-05-28 LAB — CHLAMYDIA/NGC RT PCR (ARMC ONLY)
Chlamydia Tr: NOT DETECTED
N gonorrhoeae: NOT DETECTED

## 2024-05-28 LAB — PREPARE RBC (CROSSMATCH)

## 2024-05-28 LAB — VARICELLA ZOSTER ANTIBODY, IGG: Varicella IgG: NONREACTIVE

## 2024-05-28 LAB — RPR: RPR Ser Ql: NONREACTIVE

## 2024-05-28 MED ORDER — ACETAMINOPHEN 325 MG PO TABS
650.0000 mg | ORAL_TABLET | Freq: Once | ORAL | Status: AC
  Filled 2024-05-28: qty 2

## 2024-05-28 MED ORDER — SODIUM CHLORIDE 0.9% IV SOLUTION: Freq: Once | INTRAVENOUS | Status: AC

## 2024-05-28 MED ORDER — DIPHENHYDRAMINE HCL 25 MG PO CAPS
25.0000 mg | ORAL_CAPSULE | Freq: Once | ORAL | Status: AC
Start: 2024-05-28 — End: 2024-05-28
  Administered 2024-05-28: 25 mg via ORAL
  Filled 2024-05-28: qty 1

## 2024-05-28 MED ORDER — VARICELLA VIRUS VACCINE LIVE 1350 PFU/0.5ML IJ SUSR: 0.5000 mL | INTRAMUSCULAR | Status: DC | PRN

## 2024-05-28 NOTE — Progress Notes (Signed)
 Subjective:   Shannon Trujillo had a NSVB on 05/27/2024. Her labor was fast and was an unplanned homebirth. EMS estimates 1-2 liter blood loss at home. 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 Able to ambulate slowly, but feels poorly. Has had routine postpartum care. Of note, patient is planning to place infant for adoption. During our visit today, two of her children, her mother, and her friend were present.  Social work has been in to see the patient. She is aware her urine tox screen was positive for cocaine.  Eating, hydrating, and voiding regularly without difficulty. Since delivery has not had BM. She is bottle feeding. Reports mild/moderate vaginal bleeding, denies passing large blood clots. Has had cramping abdomen pain relieved with tylenol /ibuprofen . Plans to use IUD (Paragard) for contraception. Denies current anxiety/depression symptoms.   Objective:  Vital signs in last 24 hours: Temp:  [97.8 F (36.6 C)-98.3 F (36.8 C)] 97.9 F (36.6 C) (09/11 1540) Pulse Rate:  [70-108] 108 (09/11 1540) Resp:  [18-20] 18 (09/11 1540) BP: (107-123)/(63-82) 116/81 (09/11 1540) SpO2:  [99 %-100 %] 100 % (09/11 1540) Weight:  [68 kg] 68 kg (09/10 1927)    General: NAD Pulmonary: no increased work of breathing Breasts: soft, non-tender, nipples without breakdown Abdomen: soft, non-tender Fundus: firm, midline, at umbilicus Lochia: light rubra, no clots Perineum: no erythema or foul odor discharge, minimal edema, laceration well approximated  Extremities: no edema, no erythema, no tenderness  Results for orders placed or performed during the hospital encounter of 05/27/24 (from the past 72 hours)  CBC     Status: Abnormal   Collection Time: 05/27/24  2:41 PM  Result Value Ref Range   WBC 16.4 (H) 4.0 - 10.5 K/uL   RBC 3.11 (L) 3.87 - 5.11 MIL/uL   Hemoglobin 8.0 (L) 12.0 - 15.0 g/dL   HCT 75.4 (L) 63.9 - 53.9 %   MCV 78.8 (L) 80.0 - 100.0 fL   MCH 25.7 (L) 26.0 - 34.0 pg   MCHC 32.7 30.0  - 36.0 g/dL   RDW 84.0 (H) 88.4 - 84.4 %   Platelets 182 150 - 400 K/uL   nRBC 0.0 0.0 - 0.2 %    Comment: Performed at [facility], [address] [place], [place] [address]  Type and screen [facility]     Status: None   Collection Time: 05/27/24  2:41 PM  Result Value Ref Range   ABO/RH(D) AB POS    Antibody Screen NEG    Sample Expiration      05/30/2024,2359 Performed at [facility], [address] [place], [place] [address]   RPR     Status: None   Collection Time: 05/27/24  2:41 PM  Result Value Ref Range   RPR Ser Ql NON REACTIVE NON REACTIVE    Comment: Performed at [facility], [address], [place], [place] [address]  Rubella screen     Status: None   Collection Time: 05/27/24  2:41 PM  Result Value Ref Range   Rubella 1.95 Immune >0.99 index    Comment: (NOTE)                                Non-immune       <0.90  Equivocal  0.90 - 0.99                                Immune           >0.99 Performed At: [facility] [address] [place], [place] [id number] Jennette Shorter MD Ey:[id number]   Rapid HIV screen (HIV 1/2 Ab+Ag)     Status: None   Collection Time: 05/27/24  2:41 PM  Result Value Ref Range   HIV-1 P24 Antigen - HIV24 NON REACTIVE NON REACTIVE    Comment: (NOTE) Detection of p24 may be inhibited by biotin in the sample, causing false negative results in acute infection.    HIV 1/2 Antibodies NON REACTIVE NON REACTIVE   Interpretation (HIV Ag Ab)      A non reactive test result means that HIV 1 or HIV 2 antibodies and HIV 1 p24 antigen were not detected in the specimen.    Comment: Performed at [facility], [address] [place], [place] [address]  Hepatitis B surface antigen     Status: None   Collection Time: 05/27/24  2:41 PM  Result Value Ref Range   Hepatitis B Surface Ag NON REACTIVE NON REACTIVE    Comment: Performed at [facility]  [facility], [address], [place], [place] [address]  Varicella zoster antibody, IgG     Status: None   Collection Time: 05/27/24  2:41 PM  Result Value Ref Range   Varicella IgG Non Reactive Non Reactive    Comment: (NOTE) **Please note reference interval change** A Reactive result is considered evidence of immunity to VZV. Reactive indicates that VZV IgG was detected consistent with previous infection and/or vaccination. A Non Reactive result indicates that VZV IgG was not detected suggesting that immunity has not been acquired. Performed At: [facility] [address] [place], [place] [id number] Jennette Shorter MD Ey:[id number]   Urine Drug Screen, [facility] only)     Status: Abnormal   Collection Time: 05/28/24 12:05 AM  Result Value Ref Range   Tricyclic, Ur Screen NONE DETECTED NONE DETECTED   Amphetamines, Ur Screen NONE DETECTED NONE DETECTED   MDMA (Ecstasy)Ur Screen NONE DETECTED NONE DETECTED   Cocaine Metabolite,Ur [place] POSITIVE (A) NONE DETECTED   Opiate, Ur Screen NONE DETECTED NONE DETECTED   Phencyclidine (PCP) Ur S NONE DETECTED NONE DETECTED   Cannabinoid 50 Ng, Ur [place] NONE DETECTED NONE DETECTED   Barbiturates, Ur Screen NONE DETECTED NONE DETECTED   Benzodiazepine, Ur Scrn NONE DETECTED NONE DETECTED   Methadone Scn, Ur NONE DETECTED NONE DETECTED    Comment: (NOTE) Tricyclics + metabolites, urine    Cutoff 1000 ng/mL Amphetamines + metabolites, urine  Cutoff 1000 ng/mL MDMA (Ecstasy), urine              Cutoff 500 ng/mL Cocaine Metabolite, urine          Cutoff 300 ng/mL Opiate + metabolites, urine        Cutoff 300 ng/mL Phencyclidine (PCP), urine         Cutoff 25 ng/mL Cannabinoid, urine                 Cutoff 50 ng/mL Barbiturates + metabolites, urine  Cutoff 200 ng/mL Benzodiazepine, urine              Cutoff 200 ng/mL Methadone, urine  Cutoff 300 ng/mL  The urine drug screen provides only a preliminary, unconfirmed analytical  test result and should not be used for non-medical purposes. Clinical consideration and professional judgment should be applied to any positive drug screen result due to possible interfering substances. A more specific alternate chemical method must be used in order to obtain a confirmed analytical result. Gas chromatography / mass spectrometry (GC/MS) is the preferred confirm atory method. Performed at [facility], [address] [place], [place] [address]   Chlamydia/NGC rt PCR [facility] only)     Status: Abnormal   Collection Time: 05/28/24 12:05 AM   Specimen: Urine, Clean Catch  Result Value Ref Range   Specimen source GC/Chlam URINE, RANDOM    Chlamydia Tr (A) NOT DETECTED    INVALID, UNABLE TO DETERMINE THE PRESENCE OF TARGET DUE TO SPECIMEN INTEGRITY. RECOLLECTION REQUESTED.    Comment:  C/ NORA LOBAUGH AT 0222 05/28/24 JG   N gonorrhoeae (A) NOT DETECTED    INVALID, UNABLE TO DETERMINE THE PRESENCE OF TARGET DUE TO SPECIMEN INTEGRITY. RECOLLECTION REQUESTED.    CommentBETHA LARA BAIL [facility] AT 0222 05/28/24 JG Performed at [facility], [address] [place], [place] [address]   CBC     Status: Abnormal   Collection Time: 05/28/24  5:14 AM  Result Value Ref Range   WBC 12.7 (H) 4.0 - 10.5 K/uL   RBC 2.62 (L) 3.87 - 5.11 MIL/uL   Hemoglobin 6.8 (L) 12.0 - 15.0 g/dL   HCT 79.4 (L) 63.9 - 53.9 %   MCV 78.2 (L) 80.0 - 100.0 fL   MCH 26.0 26.0 - 34.0 pg   MCHC 33.2 30.0 - 36.0 g/dL   RDW 83.9 (H) 88.4 - 84.4 %   Platelets 172 150 - 400 K/uL   nRBC 0.0 0.0 - 0.2 %    Comment: Performed at [facility], [address], [place], [place] [address]  Chlamydia/NGC rt PCR [facility] only)     Status: None   Collection Time: 05/28/24  9:03 AM   Specimen: Urine  Result Value Ref Range   Specimen source GC/Chlam URINE, RANDOM    Chlamydia Tr NOT DETECTED NOT DETECTED   N gonorrhoeae NOT DETECTED NOT DETECTED    Comment: (NOTE) This CT/NG assay has not been  evaluated in patients with a history of  hysterectomy. Performed at [facility], [address] [place], [place] [address]      Assessment:   28 y.o. [id number] postpartum day 1 s/p spontaneous vaginal delivery on 05/27/2024 Bottle Anemia secondary to acute blood loss, is significant for admission- dizziness/lightheadedness, fatigue, and pallor Vital signs Reviewed and stable Pain fairly well controlled  Plan:    PO Fe now. S/p IV iron  infusion overnight. Transfuse 2 u PRBC now.  Blood Type --/--/AB POS (09/10 1441) / Rubella 1.95 (09/10 1441) / Varicella Unknown Rhogam: not indicated Tdap: Ordered Varicella:ordered Rubella: not indicated Flu: Ordered Feeding plan bottle, lactation support Placing infant for adoption. Social work has been in to see patient. Education given regarding options for contraception, as well as compatibility with breast feeding if applicable.  Patient plans on IUD (Paragard) for contraception. Specifically declines pre-discharge Nexplanon insertion. Continued routine postpartum care  Counseled on normal uterine involution and vaginal bleeding postpartum Anticipate discharge home tomorrow    Charma DOMINO, CNM [facility] 05/28/2024, 5:00 PM

## 2024-05-28 NOTE — Clinical Social Work Maternal (Addendum)
 CLINICAL SOCIAL WORK MATERNAL/CHILD NOTE  Patient Details  Name: ANDA SOBOTTA MRN: [id number] Date of Birth: 03/04/1996  Date:  07/28/2024  Clinical Social Worker Initiating Note:  Corrie Ruts Date/Time: Initiated:  05/28/24/1130     Child's Name:  No Name   Biological Parents:  Mother   Need for Interpreter:  None   Reason for Referral:  Late or No Prenatal Care  , Current Substance Use/Substance Use During Pregnancy  , Adoption   Address:  [address] [place] [address]    Phone number:  [phone] (home)     Additional phone number:   Household Members/Support Persons (HM/SP):   Household Member/Support Person 1, Household Member/Support Person 4, Household Member/Support Person 7, Household Member/Support Person 2   HM/SP Name Relationship DOB or Age  HM/SP -1 Lamarr Console Patient mother    HM/SP -2 Yajayra Feldt Patient brother 55  HM/SP -3        HM/SP -4 Bettye Haws Daughter 5  HM/SP -5        HM/SP -6        HM/SP -7 Fish farm manager Son 2  HM/SP -8          Natural Supports (not living in the home):  Parent   Professional Supports:     Employment: Environmental education officer   Type of Work: [employer]   Education:  [employer] school graduate   Homebound arranged:    Surveyor, quantity Resources:  Medicaid   Other Resources:      Cultural/Religious Considerations Which May Impact Care:    Strengths:  Ability to meet basic needs  , Compliance with medical plan     Psychotropic Medications:         Pediatrician:       Pediatrician List:   [facility]    [place]    [facility]      Pediatrician Fax Number:    Risk Factors/Current Problems:  Substance Use     Cognitive State:  Alert     Mood/Affect:  Happy     CSW Assessment:  Chart reviewed. I spoke with the patient at bedside today. I introduced myself, my role, and reason for consult. The patient reports that she was doing well after  birth. The patient reports that her address is [address], [place], [address] and her telephone number is [phone]. The patient reports that she has her mother for support. The patient reports that her highest level of education is high school diploma and she currently employed at [employer]. The patient reports that she lives in the home with her mother, brother, son, and daughter. The patient reports that she has no mental health history. The patient denies any past or current SI/HI/DV. The patient reports that she would like to have PCP resources.   I reviewed information on Post partum depression and I will put PCP resources on the patient AVS.   I consulted with the patient about Adoption agencies and process. I provided the patient with a list of open adoption agencies. The patient agreed to identify one and follow up with SW.  I discussed the law and hospital policy for drug exposed children. The patient verbalized understanding.   The patient reports that her mother will assist during discharge.  I have made a CPS report with [facility].          CSW Plan/Description:  CSW Awaiting CPS Disposition Plan, Hospital Drug Screen Policy Information, Other Information/Referral to [facility] JINNY Ruts, [place] 01-25-24, 4:15 PM

## 2024-05-29 LAB — BPAM RBC
Blood Product Expiration Date: 202510112359
Blood Product Expiration Date: 202510122359
ISSUE DATE / TIME: 202509111740
ISSUE DATE / TIME: 202509112054
Unit Type and Rh: 6200
Unit Type and Rh: 6200

## 2024-05-29 LAB — TYPE AND SCREEN
ABO/RH(D): AB POS
Antibody Screen: NEGATIVE
Unit division: 0
Unit division: 0

## 2024-05-29 LAB — CBC
HCT: 24.9 % — ABNORMAL LOW (ref 36.0–46.0)
Hemoglobin: 8.2 g/dL — ABNORMAL LOW (ref 12.0–15.0)
MCH: 27 pg (ref 26.0–34.0)
MCHC: 32.9 g/dL (ref 30.0–36.0)
MCV: 81.9 fL (ref 80.0–100.0)
Platelets: 153 K/uL (ref 150–400)
RBC: 3.04 MIL/uL — ABNORMAL LOW (ref 3.87–5.11)
RDW: 16.2 % — ABNORMAL HIGH (ref 11.5–15.5)
WBC: 8.5 K/uL (ref 4.0–10.5)
nRBC: 0 % (ref 0.0–0.2)

## 2024-05-29 LAB — SURGICAL PATHOLOGY

## 2024-05-29 NOTE — Progress Notes (Signed)
 I received a call from the patients primary nurse that the patient desires to put the baby up for adoption. The patient did not contact an adoption agency prior to delivery. The patient did reach out to close friends to find someone to adopt her baby that she trusted and would allow her to proceed with an open adoption. Primary nurse reached out to [facility] to get guidance with the process. The response she received from [facility] is that they could not assist with the adoption process. They stated that the mother would have to abandon the baby and then they could get DSS involved. I reached out to our legal team to see if they could assist. They let me know that they do not offer resources that the adoptive parents would have to seek legal counsel. I went in and spoke with the biological mother. I explained the information that we had received from [facility] and legal. She was holding baby at the time I was in the room and caressing the baby's head. She stated, I will take my baby home and allow the adoptive parents to complete the paperwork necessary and then give her to them. I will not abandon my baby and not know that she is going to a safe and loving home. After speaking with the mother I followed up with Nola her primary nurse about the mothers wishes.

## 2024-05-29 NOTE — Discharge Summary (Signed)
 OB Discharge Summary     Patient Name: Shannon Trujillo DOB: 12/20/1995 MRN: [id number]  Date of admission: 05/27/2024 Delivering MD: Lolita Loots, CNM  Date of Delivery: 05/29/2024  Date of discharge: 05/29/2024  Admitting diagnosis: Labor and delivery, indication for care [O75.9] Intrauterine pregnancy: Unknown     Secondary diagnosis: Anemia     Discharge diagnosis: Term Pregnancy Delivered, Anemia, and PPH                                                                                                Post partum procedures:blood transfusion  Augmentation: N/A  Complications: unplanned homebirth with PPH and transfer to hospital via EMS  Hospital course:   Patient delivered baby at home, had postpartum hemorrhage and then arrived to hospital via EMS. Bleeding was stabilized, she also received two units of blood yesterday. She had no prenatal care. UDS was positive for cocaine. Patient has been planning a private adoption but has not had any formal paperwork completed. Social work consult has been completed due to positive UDS. Social work called CPS who has not yet made a plan for the baby. Baby cannot be discharge to intended adoptive parents as there is no legal paperwork and due to UDS needs CPS approval or plan before potential discharge home with Shannon Trujillo. Currently [name] is stable and she medically can be discharged. Will room in with infant as CPS determines next steps for her infant.    Subjective:  Pt. Is eating, hydrating, and voiding regularly without difficulty. Has yet to have BM. She is bottle feeding. Reports small amount of vaginal bleeding, denies passing large blood clots. Has had cramping abdomen pain relieved with tylenol /ibuprofen . Unsure of plans for contraception, declines anything currently. Denies anxiety/depression symptoms. Endorses good support from partner and family.     Physical exam  Vitals:   05/28/24 2322 05/29/24 0322 05/29/24 0829 05/29/24  1216  BP: 104/65 121/79 108/70 113/85  Pulse: 68 63 65 (!) 112  Resp: 18 18  20   Temp: 98 F (36.7 C) 98 F (36.7 C) 98 F (36.7 C) 97.7 F (36.5 C)  TempSrc: Oral Oral Oral Oral  SpO2: 100% 100% 100% 97%  Weight:      Height:       General: alert, cooperative, and no distress Breast: soft, non-tender, nipples without breakdown Lochia: appropriate Uterine Fundus: firm Perineum: no erythema or foul odor discharge, minimal edema DVT Evaluation: No evidence of DVT seen on physical exam. Negative Homan's sign. No cords or calf tenderness.  Labs: Lab Results  Component Value Date   WBC 8.5 05/29/2024   HGB 8.2 (L) 05/29/2024   HCT 24.9 (L) 05/29/2024   MCV 81.9 05/29/2024   PLT 153 05/29/2024    Discharge instruction: in After Visit Summary.  Medications:  Allergies as of 05/29/2024   No Known Allergies      Medication List     STOP taking these medications    acetaminophen  325 MG tablet Commonly known as: Tylenol    acetaminophen  500 MG tablet Commonly known as: TYLENOL    Blood Pressure  Kit Devi   Hyoscyamine  Sulfate SL 0.125 MG Subl Commonly known as: Levsin /SL   ibuprofen  600 MG tablet Commonly known as: ADVIL    labetalol  200 MG tablet Commonly known as: NORMODYNE    lidocaine  5 % Commonly known as: Lidoderm    loperamide  2 MG capsule Commonly known as: IMODIUM    NIFEdipine  30 MG 24 hr tablet Commonly known as: ADALAT  CC   ondansetron  4 MG tablet Commonly known as: ZOFRAN    oxyCODONE -acetaminophen  5-325 MG tablet Commonly known as: Percocet   triamcinolone  cream 0.1 % Commonly known as: KENALOG        TAKE these medications    Prenatal 27-1 MG Tabs Take 1 tablet by mouth daily.         Activity: Advance as tolerated. Pelvic rest for 6 weeks.   Outpatient follow up:  Follow-up Information     [facility] at [facility] Follow up.   Specialty: Obstetrics and Gynecology Why: call to schedule a two week  telehealth visit and six week in person postpartum visit Contact information: [address] [place]  [phone]                  Postpartum contraception: Undecided Rhogam Given postpartum: not indicated Rubella vaccine given postpartum: not indicated Varicella vaccine given postpartum: to be offered before discharge TDaP given antepartum or postpartum:  to be offered before discharge  Newborn Data: Live born female  Birth Weight: 7 lb 2.3 oz (3240 g) APGAR: ,   Newborn Delivery   Birth date/time: 05/27/2024 12:29:00 Delivery type: Vaginal, Spontaneous      Baby Feeding: Bottle  Disposition:rooming in   Lolita Loots, [place], [facility] 05/29/2024 3:49 PM

## 2024-05-29 NOTE — TOC Progression Note (Addendum)
 Transition of Care [facility]) - Progression Note    Patient Details  Name: Shannon Trujillo MRN: [id number] Date of Birth: Feb 03, 1996  [facility]) CM/SW Contact  [place] L Tinlee [place], [place] Phone Number: 05/29/2024, 11:03 AM  Clinical Narrative:     CSW spoke with patient. CSW reminded patient that she was provided resources for adoption agencies yesterday. CSW advised that if hospital is involved, she would need to choose an agency. CSW advised patient that if she chooses to leave with the baby at discharge, then she can proceed with her plan privately.   DSS was contacted regarding the concerns for cocaine use during pregnancy but not the adoption as the mother has developed her own plan. Patient stated she will follow-up with the adoptive family and she will advise the nurses as to what her plan is. Patient had not contacted any agencies.   CSW will follow up with [facility] DSS.   New DSS report made after RN sent secure chat advising that MOB now wants to discharge with baby and she and adoptive parents will work on Pharmacist, community. CSW advised that patient can not be prevented from discharge and explained DSS policy. Per DSS policy, the MOB is allowed to make her own plan for discharge.   This information was not received well by RN. RN then advised that MOB can remain inpatient.   CSW has not received word from [facility] DSS regarding CPS report.    Mother: Shannon Trujillo DOB: June 02, 1996 Address: [address]  [place] [address]   Baby: Shannon  Sebastian Rumalda Trujillo   Adoptive parents: Delon Edna Trujillo and Evalene Fritz Trujillo [place].    Concern: Yesterday a CPS report was made concern substance abuse affected newborn. The mother had a plan for adoption. We met with her yesterday and provided resources to adoption agencies. She has potential parents ready to take the baby today however, mother has not contacted any agencies to legally proceed with the adoption. She  was advised of her options. Now the mother is advising that she is discharging with the baby, and she had the baby over to the adoptive parents. MOB has done this before. FOB doesn't seem to be involved. We are concerned because it seems sketchy. MOB doesn't want the baby, but she did not follow up with recommendations. The hospital is not going to facilitate the signing of any documentation because the mother has not contacted an adoption agency.   We'd also like a follow-up to the report made yesterday.   1:35PM: Per [facility] DSS. The report made on 05/28/24 was screened out. Still waiting to hear about report made today.   3:32pm: Update received from [facility]. The CPS report has not been reviewed.    4:09pm-Confirmation CPS report received. Report is being reviewed for screening process.    5:08pm- CPS report Screened out. CSW spoke with Intake SW, Johnette Trujillo. As I previously advised, According to applicable DSS policy, the hospital does not have the authority to prevent discharge of the infant with the mother. The mother has submitted and finalized an appropriate plan for the baby's care, which meets all required conditions.                Expected Discharge Plan and Services  Social Drivers of Health (SDOH) Interventions SDOH Screenings   Food Insecurity: No Food Insecurity (05/27/2024)  Housing: Low Risk  (05/27/2024)  Transportation Needs: No Transportation Needs (05/27/2024)  Utilities: Not At Risk (05/27/2024)  Depression (PHQ2-9): Low Risk  (09/12/2021)  Tobacco Use: High Risk (05/27/2024)    Readmission Risk Interventions     No data to display

## 2024-05-29 NOTE — Progress Notes (Signed)
 Patient discharged home with family. Discharge instructions, when to follow up, and prescriptions reviewed with patient. Patient verbalized understanding. Patient will be escorted out by auxiliary.

## 2024-05-29 NOTE — Progress Notes (Addendum)
 Patient discharged to home with infant. Patient left 3rd floor via wheelchair. Accompanied by patient's mother, the future adoptive mom and her son, and Ronal Ruth NT.

## 2024-06-02 NOTE — H&P (Signed)
 History and Physical   HPI  Shannon Trujillo is a 28 y.o. [phone] at Unknown Estimated Date of Delivery: None noted.She presented via EMS with her baby in her arms. She is being admitted for SVD at home with no prenatal care. She states her water broke at the time of delivery. EMS noted significant amount of blood loss in route.    OB History  OB History  Gravida Para Term Preterm AB Living  4 4 2 1  0 4  SAB IAB Ectopic Multiple Live Births  0 0 0 0 4    # Outcome Date GA Lbr Len/2nd Weight Sex Type Anes PTL Lv  4 Para 05/27/24   3240 g F Vag-Spont None  LIV     Name: Shannon Trujillo  3 Preterm 09/19/21 101w1d 06:17 2710 g M Vag-Spont None  LIV     Name: Shannon Trujillo     Apgar1: 8  Apgar5: 9  2 Term 03/24/18 [redacted]w[redacted]d 03:56 / 00:12 3410 g F Vag-Spont None  LIV     Name: Shannon Trujillo     Apgar1: 9  Apgar5: 9  1 Term 12/03/15 [redacted]w[redacted]d 05:19 / 00:20 2948 g F Vag-Spont None  LIV     Birth Comments: baby was adopted     Name: Shannon Trujillo     Apgar1: 8  Apgar5: 9    PROBLEM LIST  Pregnancy complications or risks: Patient Active Problem List   Diagnosis Date Noted   Labor and delivery, indication for care 05/27/2024   Postpartum hypertension 09/20/2021   Cholestasis during pregnancy in third trimester 09/07/2021   Supervision of high risk pregnancy, antepartum 09/06/2021   History of pregnancy induced hypertension 09/06/2021   SVD (spontaneous vaginal delivery) 03/24/2018    Prenatal labs and studies: ABO, Rh: --/--/AB POS (09/10 1441) Antibody: NEG (09/10 1441) Rubella: 1.95 (09/10 1441) RPR: NON REACTIVE (09/10 1441)  HBsAg: NON REACTIVE (09/10 1441)  HIV: NON REACTIVE (09/10 1441)  GBS: unknown   Past Medical History:  Diagnosis Date   Anemia in pregnancy 02/18/2018   Cholestasis of pregnancy    Postpartum hemorrhage 12/03/2015     Past Surgical History:  Procedure Laterality Date   NO PAST SURGERIES       Medications     Discharge Medication List as of 05/29/2024  4:32 PM     CONTINUE these medications which have NOT CHANGED   Details  Prenatal 27-1 MG TABS Take 1 tablet by mouth daily., Starting Wed 09/06/2021, Normal       STOP taking these medications     acetaminophen  (TYLENOL ) 325 MG tablet Comments:  Reason for Stopping:       acetaminophen  (TYLENOL ) 500 MG tablet Comments:  Reason for Stopping:       Blood Pressure Monitoring (BLOOD PRESSURE KIT) DEVI Comments:  Reason for Stopping:       Hyoscyamine  Sulfate SL (LEVSIN /SL) 0.125 MG SUBL Comments:  Reason for Stopping:       ibuprofen  (ADVIL ) 600 MG tablet Comments:  Reason for Stopping:       ibuprofen  (ADVIL ) 600 MG tablet Comments:  Reason for Stopping:       labetalol  (NORMODYNE ) 200 MG tablet Comments:  Reason for Stopping:       lidocaine  (LIDODERM ) 5 % Comments:  Reason for Stopping:       loperamide  (IMODIUM ) 2 MG capsule Comments:  Reason for Stopping:       NIFEdipine  (ADALAT  CC) 30 MG  24 hr tablet Comments:  Reason for Stopping:       ondansetron  (ZOFRAN ) 4 MG tablet Comments:  Reason for Stopping:       oxyCODONE -acetaminophen  (PERCOCET) 5-325 MG tablet Comments:  Reason for Stopping:       triamcinolone  cream (KENALOG ) 0.1 % Comments:  Reason for Stopping:           Allergies  Patient has no known allergies.  Review of Systems  Review of systems not obtained due to patient factors.  Physical Exam  BP 113/85 (BP Location: Left Arm)   Pulse (!) 112   Temp 98.9 F (37.2 C) (Axillary)   Resp 20   Ht 5' 5 (1.651 m)   Wt 68 kg   SpO2 97%   Breastfeeding Unknown   BMI 24.94 kg/m   Lungs:  CTA B Cardio: RRR without M/R/G Abd: Soft, gravid, NT EXT: No C/C/ 1+ Edema DTRs: 2+ B    Test Results  No results found for this or any previous visit (from the past 24 hours). Other:  no prenatal care  Assessment   [phone] at Unknown Estimated Date of Delivery: None noted.  The fetus is  reassuring.   Patient Active Problem List   Diagnosis Date Noted   Labor and delivery, indication for care 05/27/2024   Postpartum hypertension 09/20/2021   Cholestasis during pregnancy in third trimester 09/07/2021   Supervision of high risk pregnancy, antepartum 09/06/2021   History of pregnancy induced hypertension 09/06/2021   SVD (spontaneous vaginal delivery) 03/24/2018    Plan  1. Admit to L&D :   2.prenatal  labs collected 3. See delivery note for details of delivery 4. Dr. Verdon aware of admission    Shannon Trujillo, [place]  06/02/2024 2:51 PM
